# Patient Record
Sex: Female | Born: 1975 | Race: White | Hispanic: No | Marital: Single | Smoking: Current every day smoker
Health system: Southern US, Community
[De-identification: ages and names within clinical notes are randomized; demographics above are authoritative.]

## PROBLEM LIST (undated history)

## (undated) DIAGNOSIS — G8929 Other chronic pain: Secondary | ICD-10-CM

## (undated) DIAGNOSIS — A812 Progressive multifocal leukoencephalopathy: Secondary | ICD-10-CM

## (undated) DIAGNOSIS — R51 Headache: Secondary | ICD-10-CM

## (undated) DIAGNOSIS — J9819 Other pulmonary collapse: Secondary | ICD-10-CM

## (undated) DIAGNOSIS — R87613 High grade squamous intraepithelial lesion on cytologic smear of cervix (HGSIL): Secondary | ICD-10-CM

## (undated) DIAGNOSIS — R519 Headache, unspecified: Secondary | ICD-10-CM

## (undated) DIAGNOSIS — N390 Urinary tract infection, site not specified: Secondary | ICD-10-CM

## (undated) DIAGNOSIS — M549 Dorsalgia, unspecified: Secondary | ICD-10-CM

## (undated) DIAGNOSIS — E119 Type 2 diabetes mellitus without complications: Secondary | ICD-10-CM

## (undated) DIAGNOSIS — E039 Hypothyroidism, unspecified: Secondary | ICD-10-CM

## (undated) DIAGNOSIS — R569 Unspecified convulsions: Secondary | ICD-10-CM

## (undated) DIAGNOSIS — J449 Chronic obstructive pulmonary disease, unspecified: Secondary | ICD-10-CM

## (undated) DIAGNOSIS — K219 Gastro-esophageal reflux disease without esophagitis: Secondary | ICD-10-CM

## (undated) DIAGNOSIS — E7800 Pure hypercholesterolemia, unspecified: Secondary | ICD-10-CM

## (undated) DIAGNOSIS — K8020 Calculus of gallbladder without cholecystitis without obstruction: Secondary | ICD-10-CM

## (undated) DIAGNOSIS — B20 Human immunodeficiency virus [HIV] disease: Secondary | ICD-10-CM

## (undated) DIAGNOSIS — R413 Other amnesia: Secondary | ICD-10-CM

## (undated) HISTORY — PX: LUNG SURGERY: SHX703

---

## 2007-06-21 ENCOUNTER — Emergency Department: Payer: Self-pay | Admitting: Emergency Medicine

## 2007-06-24 ENCOUNTER — Emergency Department: Payer: Self-pay | Admitting: Emergency Medicine

## 2008-06-20 ENCOUNTER — Emergency Department: Payer: Self-pay | Admitting: Emergency Medicine

## 2009-07-25 ENCOUNTER — Emergency Department: Payer: Self-pay | Admitting: Emergency Medicine

## 2012-05-17 ENCOUNTER — Emergency Department: Payer: Self-pay | Admitting: *Deleted

## 2012-05-17 LAB — CBC
MCH: 32.6 pg (ref 26.0–34.0)
MCV: 92 fL (ref 80–100)
Platelet: 215 10*3/uL (ref 150–440)
RDW: 13.7 % (ref 11.5–14.5)

## 2012-05-17 LAB — COMPREHENSIVE METABOLIC PANEL
Albumin: 3.4 g/dL (ref 3.4–5.0)
Alkaline Phosphatase: 55 U/L (ref 50–136)
Anion Gap: 8 (ref 7–16)
BUN: 9 mg/dL (ref 7–18)
Bilirubin,Total: 0.8 mg/dL (ref 0.2–1.0)
EGFR (African American): >60
Glucose: 92 mg/dL (ref 65–99)
Osmolality: 268 (ref 275–301)
Potassium: 3.2 mmol/L — ABNORMAL LOW (ref 3.5–5.1)
SGOT(AST): 40 U/L — ABNORMAL HIGH (ref 15–37)
SGPT (ALT): 25 U/L

## 2012-05-17 LAB — URINALYSIS, COMPLETE
Blood: NEGATIVE
Granular Cast: 46
Ketone: NEGATIVE
Nitrite: NEGATIVE
Protein: 100
Specific Gravity: 1.024 (ref 1.003–1.030)

## 2012-05-17 LAB — PREGNANCY, URINE: Pregnancy Test, Urine: NEGATIVE m[IU]/mL

## 2012-05-20 LAB — URINE CULTURE

## 2012-09-13 ENCOUNTER — Emergency Department: Payer: Self-pay | Admitting: Emergency Medicine

## 2012-09-13 LAB — URINALYSIS, COMPLETE
Bilirubin,UR: NEGATIVE
Blood: NEGATIVE
Nitrite: POSITIVE
Protein: 100
RBC,UR: 21 /HPF (ref 0–5)
WBC UR: 375 /HPF (ref 0–5)

## 2012-09-13 LAB — COMPREHENSIVE METABOLIC PANEL
Albumin: 3.1 g/dL — ABNORMAL LOW (ref 3.4–5.0)
Anion Gap: 10 (ref 7–16)
BUN: 7 mg/dL (ref 7–18)
Bilirubin,Total: 0.7 mg/dL (ref 0.2–1.0)
Calcium, Total: 8.8 mg/dL (ref 8.5–10.1)
Chloride: 100 mmol/L (ref 98–107)
Creatinine: 0.74 mg/dL (ref 0.60–1.30)
Glucose: 98 mg/dL (ref 65–99)
Potassium: 2.8 mmol/L — ABNORMAL LOW (ref 3.5–5.1)
SGOT(AST): 28 U/L (ref 15–37)
Sodium: 136 mmol/L (ref 136–145)
Total Protein: 9.8 g/dL — ABNORMAL HIGH (ref 6.4–8.2)

## 2012-09-13 LAB — CBC
HCT: 32 % — ABNORMAL LOW (ref 35.0–47.0)
MCH: 30.8 pg (ref 26.0–34.0)
MCHC: 36 g/dL (ref 32.0–36.0)
MCV: 86 fL (ref 80–100)
Platelet: 194 10*3/uL (ref 150–440)
RBC: 3.74 10*6/uL — ABNORMAL LOW (ref 3.80–5.20)
RDW: 14.2 % (ref 11.5–14.5)

## 2012-10-12 ENCOUNTER — Emergency Department: Payer: Self-pay | Admitting: Emergency Medicine

## 2012-10-12 LAB — URINALYSIS, COMPLETE
Bacteria: NONE SEEN
Bilirubin,UR: NEGATIVE
Nitrite: NEGATIVE
Protein: NEGATIVE
Specific Gravity: 1.004 (ref 1.003–1.030)
WBC UR: 5 /HPF (ref 0–5)

## 2012-10-12 LAB — CBC
HCT: 32.2 % — ABNORMAL LOW (ref 35.0–47.0)
HGB: 11.2 g/dL — ABNORMAL LOW (ref 12.0–16.0)
MCHC: 34.7 g/dL (ref 32.0–36.0)
MCV: 89 fL (ref 80–100)
RDW: 18 % — ABNORMAL HIGH (ref 11.5–14.5)
WBC: 3.9 10*3/uL (ref 3.6–11.0)

## 2012-10-12 LAB — BASIC METABOLIC PANEL
Anion Gap: 9 (ref 7–16)
Calcium, Total: 8.7 mg/dL (ref 8.5–10.1)
Chloride: 105 mmol/L (ref 98–107)
Co2: 26 mmol/L (ref 21–32)
Creatinine: 0.62 mg/dL (ref 0.60–1.30)
Osmolality: 277 (ref 275–301)

## 2012-10-12 LAB — PROTIME-INR
INR: 0.9
Prothrombin Time: 12.9 secs (ref 11.5–14.7)

## 2012-10-12 LAB — HEPATIC FUNCTION PANEL A (ARMC)
Albumin: 3.3 g/dL — ABNORMAL LOW (ref 3.4–5.0)
Alkaline Phosphatase: 81 U/L (ref 50–136)
Bilirubin, Direct: <0.1 mg/dL (ref 0.00–0.20)
SGOT(AST): 114 U/L — ABNORMAL HIGH (ref 15–37)

## 2012-10-12 LAB — APTT: Activated PTT: 31.2 secs (ref 23.6–35.9)

## 2012-12-05 ENCOUNTER — Emergency Department: Payer: Self-pay | Admitting: Emergency Medicine

## 2012-12-31 ENCOUNTER — Ambulatory Visit: Payer: Self-pay | Admitting: Internal Medicine

## 2013-01-17 ENCOUNTER — Inpatient Hospital Stay: Payer: Self-pay | Admitting: Internal Medicine

## 2013-01-17 LAB — CBC WITH DIFFERENTIAL/PLATELET
Basophil #: 0 10*3/uL (ref 0.0–0.1)
Basophil %: 0.3 %
HGB: 11.2 g/dL — ABNORMAL LOW (ref 12.0–16.0)
Lymphocyte #: 1.2 10*3/uL (ref 1.0–3.6)
Lymphocyte %: 18.1 %
MCV: 88 fL (ref 80–100)
Monocyte #: 0.7 x10 3/mm (ref 0.2–0.9)
Monocyte %: 10.3 %
Neutrophil #: 4.7 10*3/uL (ref 1.4–6.5)
Neutrophil %: 71.2 %
Platelet: 288 10*3/uL (ref 150–440)
RBC: 3.66 10*6/uL — ABNORMAL LOW (ref 3.80–5.20)
RDW: 13 % (ref 11.5–14.5)
WBC: 6.7 10*3/uL (ref 3.6–11.0)

## 2013-01-17 LAB — COMPREHENSIVE METABOLIC PANEL
Albumin: 2.7 g/dL — ABNORMAL LOW (ref 3.4–5.0)
Alkaline Phosphatase: 59 U/L (ref 50–136)
BUN: 11 mg/dL (ref 7–18)
Bilirubin,Total: 0.5 mg/dL (ref 0.2–1.0)
Chloride: 99 mmol/L (ref 98–107)
Co2: 27 mmol/L (ref 21–32)
Creatinine: 0.78 mg/dL (ref 0.60–1.30)
EGFR (African American): >60
EGFR (Non-African Amer.): >60
Glucose: 107 mg/dL — ABNORMAL HIGH (ref 65–99)
Osmolality: 268 (ref 275–301)
Potassium: 2.7 mmol/L — ABNORMAL LOW (ref 3.5–5.1)
SGOT(AST): 64 U/L — ABNORMAL HIGH (ref 15–37)
SGPT (ALT): 13 U/L (ref 12–78)
Sodium: 134 mmol/L — ABNORMAL LOW (ref 136–145)
Total Protein: 9.7 g/dL — ABNORMAL HIGH (ref 6.4–8.2)

## 2013-01-17 LAB — URINALYSIS, COMPLETE
Glucose,UR: NEGATIVE mg/dL (ref 0–75)
Ketone: NEGATIVE
Nitrite: NEGATIVE
Protein: 100
RBC,UR: 47 /HPF (ref 0–5)

## 2013-01-17 LAB — LIPASE, BLOOD: Lipase: 311 U/L (ref 73–393)

## 2013-01-17 LAB — PRO B NATRIURETIC PEPTIDE: B-Type Natriuretic Peptide: 150 pg/mL — ABNORMAL HIGH (ref 0–125)

## 2013-01-17 LAB — TROPONIN I: Troponin-I: <0.02 ng/mL

## 2013-01-18 DIAGNOSIS — I369 Nonrheumatic tricuspid valve disorder, unspecified: Secondary | ICD-10-CM

## 2013-01-18 LAB — DRUG SCREEN, URINE
Amphetamines, Ur Screen: NEGATIVE (ref ?–1000)
Barbiturates, Ur Screen: NEGATIVE (ref ?–200)
Benzodiazepine, Ur Scrn: NEGATIVE (ref ?–200)
Cannabinoid 50 Ng, Ur ~~LOC~~: NEGATIVE (ref ?–50)
Opiate, Ur Screen: NEGATIVE (ref ?–300)
Phencyclidine (PCP) Ur S: NEGATIVE (ref ?–25)
Tricyclic, Ur Screen: NEGATIVE (ref ?–1000)

## 2013-01-18 LAB — BASIC METABOLIC PANEL
Anion Gap: 6 — ABNORMAL LOW (ref 7–16)
BUN: 9 mg/dL (ref 7–18)
Calcium, Total: 7.9 mg/dL — ABNORMAL LOW (ref 8.5–10.1)
Chloride: 108 mmol/L — ABNORMAL HIGH (ref 98–107)
Co2: 24 mmol/L (ref 21–32)
EGFR (African American): >60
Osmolality: 274 (ref 275–301)
Potassium: 3.7 mmol/L (ref 3.5–5.1)
Sodium: 138 mmol/L (ref 136–145)

## 2013-01-18 LAB — CBC WITH DIFFERENTIAL/PLATELET
Eosinophil #: 0 10*3/uL (ref 0.0–0.7)
HCT: 28.5 % — ABNORMAL LOW (ref 35.0–47.0)
MCHC: 34.4 g/dL (ref 32.0–36.0)
MCV: 88 fL (ref 80–100)
Monocyte #: 0.4 x10 3/mm (ref 0.2–0.9)
Neutrophil %: 70.9 %
Platelet: 228 10*3/uL (ref 150–440)
RBC: 3.24 10*6/uL — ABNORMAL LOW (ref 3.80–5.20)
RDW: 13.1 % (ref 11.5–14.5)
WBC: 3.9 10*3/uL (ref 3.6–11.0)

## 2013-01-18 LAB — RAPID HIV-1/2 QL/CONFIRM: HIV-1/2,Rapid Ql: POSITIVE

## 2013-01-18 LAB — HEMOGLOBIN A1C: Hemoglobin A1C: 4.4 % (ref 4.2–6.3)

## 2013-01-19 LAB — BASIC METABOLIC PANEL
BUN: 9 mg/dL (ref 7–18)
Calcium, Total: 8.4 mg/dL — ABNORMAL LOW (ref 8.5–10.1)
Chloride: 109 mmol/L — ABNORMAL HIGH (ref 98–107)
Co2: 25 mmol/L (ref 21–32)
EGFR (African American): >60
Osmolality: 281 (ref 275–301)

## 2013-01-19 LAB — LACTATE DEHYDROGENASE: LDH: 707 U/L — ABNORMAL HIGH (ref 81–246)

## 2013-01-20 LAB — CBC WITH DIFFERENTIAL/PLATELET
Basophil #: 0 10*3/uL (ref 0.0–0.1)
Basophil %: 0.1 %
Eosinophil #: 0 10*3/uL (ref 0.0–0.7)
Eosinophil %: 0 %
HCT: 25.8 % — ABNORMAL LOW (ref 35.0–47.0)
HGB: 9.2 g/dL — ABNORMAL LOW (ref 12.0–16.0)
Lymphocyte #: 0.4 10*3/uL — ABNORMAL LOW (ref 1.0–3.6)
Lymphocyte %: 5.3 %
MCH: 31.1 pg (ref 26.0–34.0)
MCV: 87 fL (ref 80–100)
Monocyte #: 0.4 x10 3/mm (ref 0.2–0.9)
Monocyte %: 4.8 %
Neutrophil #: 7.6 10*3/uL — ABNORMAL HIGH (ref 1.4–6.5)
Neutrophil %: 89.8 %
Platelet: 285 10*3/uL (ref 150–440)
RBC: 2.96 10*6/uL — ABNORMAL LOW (ref 3.80–5.20)
RDW: 13 % (ref 11.5–14.5)
WBC: 8.4 10*3/uL (ref 3.6–11.0)

## 2013-01-20 LAB — BASIC METABOLIC PANEL
Chloride: 109 mmol/L — ABNORMAL HIGH (ref 98–107)
EGFR (Non-African Amer.): >60
Glucose: 109 mg/dL — ABNORMAL HIGH (ref 65–99)
Osmolality: 279 (ref 275–301)

## 2013-01-20 LAB — URINE CULTURE

## 2013-01-23 LAB — CULTURE, BLOOD (SINGLE)

## 2013-01-24 LAB — PLATELET COUNT: Platelet: 389 10*3/uL (ref 150–440)

## 2013-01-25 LAB — CBC WITH DIFFERENTIAL/PLATELET
Basophil #: 0 10*3/uL (ref 0.0–0.1)
Basophil %: 0.2 %
Eosinophil #: 0 10*3/uL (ref 0.0–0.7)
HCT: 32.8 % — ABNORMAL LOW (ref 35.0–47.0)
HGB: 11.7 g/dL — ABNORMAL LOW (ref 12.0–16.0)
Lymphocyte #: 0.6 10*3/uL — ABNORMAL LOW (ref 1.0–3.6)
Lymphocyte %: 6.9 %
MCH: 31.4 pg (ref 26.0–34.0)
MCHC: 35.7 g/dL (ref 32.0–36.0)
Monocyte %: 5 %
RBC: 3.73 10*6/uL — ABNORMAL LOW (ref 3.80–5.20)
RDW: 13.4 % (ref 11.5–14.5)
WBC: 8.5 10*3/uL (ref 3.6–11.0)

## 2013-01-25 LAB — BASIC METABOLIC PANEL
Co2: 22 mmol/L (ref 21–32)
Creatinine: 0.63 mg/dL (ref 0.60–1.30)
Glucose: 120 mg/dL — ABNORMAL HIGH (ref 65–99)
Osmolality: 257 (ref 275–301)
Sodium: 127 mmol/L — ABNORMAL LOW (ref 136–145)

## 2013-01-25 LAB — OSMOLALITY, URINE: Osmolality: 472 mOsm/kg

## 2013-01-25 LAB — SODIUM, URINE, RANDOM: Sodium, Urine Random: 96 mmol/L (ref 20–110)

## 2013-01-26 LAB — CBC WITH DIFFERENTIAL/PLATELET
Basophil %: 0.1 %
Eosinophil #: 0 10*3/uL (ref 0.0–0.7)
HCT: 36.2 % (ref 35.0–47.0)
HGB: 12.8 g/dL (ref 12.0–16.0)
Lymphocyte #: 0.7 10*3/uL — ABNORMAL LOW (ref 1.0–3.6)
Lymphocyte %: 7 %
MCHC: 35.4 g/dL (ref 32.0–36.0)
MCV: 88 fL (ref 80–100)
Monocyte #: 0.7 x10 3/mm (ref 0.2–0.9)
Neutrophil #: 8.5 10*3/uL — ABNORMAL HIGH (ref 1.4–6.5)
Neutrophil %: 86.1 %
RBC: 4.1 10*6/uL (ref 3.80–5.20)
RDW: 13.8 % (ref 11.5–14.5)

## 2013-01-26 LAB — BASIC METABOLIC PANEL
BUN: 18 mg/dL (ref 7–18)
EGFR (Non-African Amer.): >60
Glucose: 85 mg/dL (ref 65–99)
Osmolality: 247 (ref 275–301)
Sodium: 122 mmol/L — ABNORMAL LOW (ref 136–145)

## 2013-01-27 LAB — CBC WITH DIFFERENTIAL/PLATELET
Basophil #: 0 10*3/uL (ref 0.0–0.1)
Basophil %: 0.1 %
Eosinophil #: 0.1 10*3/uL (ref 0.0–0.7)
Eosinophil %: 1.3 %
Lymphocyte #: 0.6 10*3/uL — ABNORMAL LOW (ref 1.0–3.6)
Lymphocyte %: 5.3 %
MCH: 29 pg (ref 26.0–34.0)
Monocyte #: 0.3 x10 3/mm (ref 0.2–0.9)
Neutrophil %: 90.1 %
Platelet: 390 10*3/uL (ref 150–440)
RBC: 4.24 10*6/uL (ref 3.80–5.20)
RDW: 13.8 % (ref 11.5–14.5)
WBC: 11 10*3/uL (ref 3.6–11.0)

## 2013-01-27 LAB — OSMOLALITY, URINE: Osmolality: 660 mOsm/kg

## 2013-01-27 LAB — BASIC METABOLIC PANEL
BUN: 24 mg/dL — ABNORMAL HIGH (ref 7–18)
Calcium, Total: 9.1 mg/dL (ref 8.5–10.1)
Creatinine: 0.72 mg/dL (ref 0.60–1.30)
EGFR (Non-African Amer.): >60
Glucose: 91 mg/dL (ref 65–99)
Potassium: 5 mmol/L (ref 3.5–5.1)
Sodium: 123 mmol/L — ABNORMAL LOW (ref 136–145)

## 2013-01-27 LAB — URIC ACID: Uric Acid: 1.6 mg/dL — ABNORMAL LOW (ref 2.6–6.0)

## 2013-01-27 LAB — SODIUM, URINE, RANDOM: Sodium, Urine Random: 75 mmol/L (ref 20–110)

## 2013-01-29 LAB — CBC WITH DIFFERENTIAL/PLATELET
Basophil %: 0.1 %
Eosinophil #: 0.2 10*3/uL (ref 0.0–0.7)
Eosinophil %: 1.3 %
HCT: 37 % (ref 35.0–47.0)
Lymphocyte #: 0.4 10*3/uL — ABNORMAL LOW (ref 1.0–3.6)
Lymphocyte %: 3.6 %
MCHC: 35.4 g/dL (ref 32.0–36.0)
MCV: 87 fL (ref 80–100)
Monocyte #: 0.4 x10 3/mm (ref 0.2–0.9)
Monocyte %: 3.2 %
Neutrophil #: 11.2 10*3/uL — ABNORMAL HIGH (ref 1.4–6.5)
Neutrophil %: 91.8 %
Platelet: 310 10*3/uL (ref 150–440)
RDW: 13.6 % (ref 11.5–14.5)

## 2013-01-29 LAB — BASIC METABOLIC PANEL
BUN: 19 mg/dL — ABNORMAL HIGH (ref 7–18)
BUN: 17 mg/dL (ref 7–18)
Calcium, Total: 8.8 mg/dL (ref 8.5–10.1)
Chloride: 84 mmol/L — ABNORMAL LOW (ref 98–107)
Co2: 26 mmol/L (ref 21–32)
Co2: 28 mmol/L (ref 21–32)
Creatinine: 0.82 mg/dL (ref 0.60–1.30)
Creatinine: 0.82 mg/dL (ref 0.60–1.30)
EGFR (African American): >60
Glucose: 88 mg/dL (ref 65–99)
Osmolality: 240 (ref 275–301)
Osmolality: 239 (ref 275–301)
Potassium: 5.2 mmol/L — ABNORMAL HIGH (ref 3.5–5.1)

## 2013-01-29 LAB — PRO B NATRIURETIC PEPTIDE: B-Type Natriuretic Peptide: 118 pg/mL (ref 0–125)

## 2013-01-30 LAB — BASIC METABOLIC PANEL
BUN: 17 mg/dL (ref 7–18)
Calcium, Total: 9.5 mg/dL (ref 8.5–10.1)
Chloride: 85 mmol/L — ABNORMAL LOW (ref 98–107)
Creatinine: 0.64 mg/dL (ref 0.60–1.30)
EGFR (Non-African Amer.): >60
Osmolality: 247 (ref 275–301)
Potassium: 4.8 mmol/L (ref 3.5–5.1)
Sodium: 122 mmol/L — ABNORMAL LOW (ref 136–145)

## 2013-01-31 ENCOUNTER — Ambulatory Visit: Payer: Self-pay | Admitting: Internal Medicine

## 2013-01-31 LAB — CBC WITH DIFFERENTIAL/PLATELET
Basophil #: 0 10*3/uL (ref 0.0–0.1)
Basophil %: 0.3 %
Eosinophil %: 1.6 %
MCH: 30.3 pg (ref 26.0–34.0)
MCHC: 34.5 g/dL (ref 32.0–36.0)
MCV: 88 fL (ref 80–100)
Monocyte %: 3.4 %
Neutrophil %: 90.5 %
RBC: 3.95 10*6/uL (ref 3.80–5.20)
WBC: 11 10*3/uL (ref 3.6–11.0)

## 2013-01-31 LAB — BASIC METABOLIC PANEL
Anion Gap: 9 (ref 7–16)
Chloride: 85 mmol/L — ABNORMAL LOW (ref 98–107)
EGFR (Non-African Amer.): >60
Glucose: 78 mg/dL (ref 65–99)
Potassium: 5 mmol/L (ref 3.5–5.1)

## 2013-02-01 LAB — BASIC METABOLIC PANEL
Anion Gap: 10 (ref 7–16)
Calcium, Total: 8.9 mg/dL (ref 8.5–10.1)
Co2: 28 mmol/L (ref 21–32)
EGFR (African American): >60
EGFR (Non-African Amer.): >60
Glucose: 85 mg/dL (ref 65–99)

## 2013-02-02 LAB — CBC WITH DIFFERENTIAL/PLATELET
Basophil %: 0.4 %
Eosinophil #: 0.3 10*3/uL (ref 0.0–0.7)
Eosinophil %: 5.7 %
Lymphocyte #: 0.3 10*3/uL — ABNORMAL LOW (ref 1.0–3.6)
Lymphocyte %: 5.6 %
MCH: 30.9 pg (ref 26.0–34.0)
MCHC: 34.9 g/dL (ref 32.0–36.0)
Monocyte #: 0.2 x10 3/mm (ref 0.2–0.9)
Neutrophil #: 4.8 10*3/uL (ref 1.4–6.5)
Neutrophil %: 84.6 %
RBC: 3.65 10*6/uL — ABNORMAL LOW (ref 3.80–5.20)
RDW: 14.6 % — ABNORMAL HIGH (ref 11.5–14.5)
WBC: 5.7 10*3/uL (ref 3.6–11.0)

## 2013-02-02 LAB — BASIC METABOLIC PANEL
Anion Gap: 8 (ref 7–16)
BUN: 14 mg/dL (ref 7–18)
Calcium, Total: 8.8 mg/dL (ref 8.5–10.1)
Chloride: 90 mmol/L — ABNORMAL LOW (ref 98–107)
Creatinine: 0.85 mg/dL (ref 0.60–1.30)
EGFR (Non-African Amer.): >60
Potassium: 4.2 mmol/L (ref 3.5–5.1)
Sodium: 124 mmol/L — ABNORMAL LOW (ref 136–145)

## 2013-02-04 LAB — BASIC METABOLIC PANEL
Anion Gap: 8 (ref 7–16)
BUN: 14 mg/dL (ref 7–18)
Calcium, Total: 8.5 mg/dL (ref 8.5–10.1)
Co2: 23 mmol/L (ref 21–32)
Creatinine: 0.7 mg/dL (ref 0.60–1.30)
EGFR (African American): >60
Glucose: 86 mg/dL (ref 65–99)
Sodium: 122 mmol/L — ABNORMAL LOW (ref 136–145)

## 2013-02-05 LAB — CBC WITH DIFFERENTIAL/PLATELET
Eosinophil %: 6.4 %
Lymphocyte #: 0.3 10*3/uL — ABNORMAL LOW (ref 1.0–3.6)
Lymphocyte %: 5.9 %
MCHC: 34.8 g/dL (ref 32.0–36.0)
Monocyte #: 0.3 x10 3/mm (ref 0.2–0.9)
Monocyte %: 4.8 %
Neutrophil #: 4.5 10*3/uL (ref 1.4–6.5)
Platelet: 186 10*3/uL (ref 150–440)
RBC: 3.5 10*6/uL — ABNORMAL LOW (ref 3.80–5.20)

## 2013-02-05 LAB — BASIC METABOLIC PANEL
Anion Gap: 6 — ABNORMAL LOW (ref 7–16)
BUN: 11 mg/dL (ref 7–18)
Calcium, Total: 8.8 mg/dL (ref 8.5–10.1)
Co2: 28 mmol/L (ref 21–32)
EGFR (African American): >60
EGFR (Non-African Amer.): >60
Glucose: 87 mg/dL (ref 65–99)
Osmolality: 252 (ref 275–301)
Potassium: 4.5 mmol/L (ref 3.5–5.1)

## 2013-02-07 LAB — BASIC METABOLIC PANEL
Anion Gap: 10 (ref 7–16)
Co2: 25 mmol/L (ref 21–32)
Creatinine: 0.6 mg/dL (ref 0.60–1.30)
EGFR (Non-African Amer.): >60
Glucose: 78 mg/dL (ref 65–99)
Osmolality: 252 (ref 275–301)

## 2013-02-08 LAB — BASIC METABOLIC PANEL
Anion Gap: 8 (ref 7–16)
BUN: 12 mg/dL (ref 7–18)
Calcium, Total: 8.7 mg/dL (ref 8.5–10.1)
Chloride: 92 mmol/L — ABNORMAL LOW (ref 98–107)
Co2: 23 mmol/L (ref 21–32)
Creatinine: 0.65 mg/dL (ref 0.60–1.30)
EGFR (African American): >60
EGFR (Non-African Amer.): >60
Glucose: 77 mg/dL (ref 65–99)
Osmolality: 246 (ref 275–301)
Potassium: 4.1 mmol/L (ref 3.5–5.1)
Sodium: 123 mmol/L — ABNORMAL LOW (ref 136–145)

## 2013-02-08 LAB — CBC WITH DIFFERENTIAL/PLATELET
Basophil #: 0 10*3/uL (ref 0.0–0.1)
Basophil %: 0.9 %
Eosinophil #: 0.3 10*3/uL (ref 0.0–0.7)
Eosinophil %: 6.1 %
HCT: 30.2 % — ABNORMAL LOW (ref 35.0–47.0)
HGB: 10.6 g/dL — ABNORMAL LOW (ref 12.0–16.0)
Lymphocyte #: 0.3 10*3/uL — ABNORMAL LOW (ref 1.0–3.6)
Lymphocyte %: 7.2 %
MCH: 31.7 pg (ref 26.0–34.0)
MCHC: 35.2 g/dL (ref 32.0–36.0)
MCV: 90 fL (ref 80–100)
Monocyte #: 0.3 x10 3/mm (ref 0.2–0.9)
Monocyte %: 6.8 %
Neutrophil #: 3.6 10*3/uL (ref 1.4–6.5)
Neutrophil %: 79 %
Platelet: 155 10*3/uL (ref 150–440)
RBC: 3.36 10*6/uL — ABNORMAL LOW (ref 3.80–5.20)
RDW: 15.4 % — ABNORMAL HIGH (ref 11.5–14.5)
WBC: 4.6 10*3/uL (ref 3.6–11.0)

## 2013-02-09 LAB — BASIC METABOLIC PANEL
Anion Gap: 9 (ref 7–16)
BUN: 10 mg/dL (ref 7–18)
Calcium, Total: 8.5 mg/dL (ref 8.5–10.1)
Creatinine: 0.57 mg/dL — ABNORMAL LOW (ref 0.60–1.30)
EGFR (African American): >60
Potassium: 4 mmol/L (ref 3.5–5.1)
Sodium: 125 mmol/L — ABNORMAL LOW (ref 136–145)

## 2013-02-12 LAB — PLATELET COUNT: Platelet: 185 x10 3/mm 3 (ref 150–440)

## 2013-02-12 LAB — SODIUM: Sodium: 129 mmol/L — ABNORMAL LOW (ref 136–145)

## 2013-02-15 LAB — BASIC METABOLIC PANEL
Anion Gap: 8 (ref 7–16)
BUN: 8 mg/dL (ref 7–18)
Calcium, Total: 8.5 mg/dL (ref 8.5–10.1)
Creatinine: 0.47 mg/dL — ABNORMAL LOW (ref 0.60–1.30)
EGFR (African American): >60
EGFR (Non-African Amer.): >60
Glucose: 89 mg/dL (ref 65–99)
Osmolality: 260 (ref 275–301)
Potassium: 3.9 mmol/L (ref 3.5–5.1)
Sodium: 131 mmol/L — ABNORMAL LOW (ref 136–145)

## 2013-02-15 LAB — CBC WITH DIFFERENTIAL/PLATELET
Basophil #: 0 10*3/uL (ref 0.0–0.1)
Eosinophil %: 11.7 %
HCT: 27.3 % — ABNORMAL LOW (ref 35.0–47.0)
Lymphocyte #: 0.4 10*3/uL — ABNORMAL LOW (ref 1.0–3.6)
Lymphocyte %: 15.8 %
MCH: 31.3 pg (ref 26.0–34.0)
MCV: 91 fL (ref 80–100)
Monocyte %: 19.6 %
Neutrophil %: 52 %
RDW: 16.7 % — ABNORMAL HIGH (ref 11.5–14.5)

## 2013-02-17 LAB — PROTIME-INR: INR: 1

## 2013-02-17 LAB — APTT: Activated PTT: 40 secs — ABNORMAL HIGH (ref 23.6–35.9)

## 2013-02-19 LAB — CBC WITH DIFFERENTIAL/PLATELET
Basophil %: 0.4 %
Lymphocyte #: 0.7 10*3/uL — ABNORMAL LOW (ref 1.0–3.6)
Lymphocyte %: 18.6 %
Monocyte #: 0.7 x10 3/mm (ref 0.2–0.9)
Neutrophil #: 2.3 10*3/uL (ref 1.4–6.5)
Neutrophil %: 61.1 %
Platelet: 254 10*3/uL (ref 150–440)
RBC: 3.12 10*6/uL — ABNORMAL LOW (ref 3.80–5.20)
RDW: 17.1 % — ABNORMAL HIGH (ref 11.5–14.5)
WBC: 3.8 10*3/uL (ref 3.6–11.0)

## 2013-02-19 LAB — BASIC METABOLIC PANEL
Anion Gap: 8 (ref 7–16)
BUN: 4 mg/dL — ABNORMAL LOW (ref 7–18)
Calcium, Total: 8 mg/dL — ABNORMAL LOW (ref 8.5–10.1)
Co2: 24 mmol/L (ref 21–32)
EGFR (Non-African Amer.): >60
Glucose: 90 mg/dL (ref 65–99)
Potassium: 3.9 mmol/L (ref 3.5–5.1)
Sodium: 133 mmol/L — ABNORMAL LOW (ref 136–145)

## 2013-02-21 LAB — BASIC METABOLIC PANEL
Anion Gap: 8 (ref 7–16)
Calcium, Total: 7.8 mg/dL — ABNORMAL LOW (ref 8.5–10.1)
Chloride: 98 mmol/L (ref 98–107)
Co2: 27 mmol/L (ref 21–32)
Creatinine: 0.47 mg/dL — ABNORMAL LOW (ref 0.60–1.30)
EGFR (African American): >60
EGFR (Non-African Amer.): >60
Osmolality: 263 (ref 275–301)
Potassium: 3.9 mmol/L (ref 3.5–5.1)
Sodium: 133 mmol/L — ABNORMAL LOW (ref 136–145)

## 2013-02-23 LAB — BASIC METABOLIC PANEL
Anion Gap: 8 (ref 7–16)
BUN: 5 mg/dL — ABNORMAL LOW (ref 7–18)
Calcium, Total: 8.2 mg/dL — ABNORMAL LOW (ref 8.5–10.1)
Chloride: 98 mmol/L (ref 98–107)
Co2: 27 mmol/L (ref 21–32)
EGFR (African American): >60
Glucose: 87 mg/dL (ref 65–99)
Sodium: 133 mmol/L — ABNORMAL LOW (ref 136–145)

## 2013-02-23 LAB — PATHOLOGY REPORT

## 2013-02-27 LAB — CREATININE, SERUM
EGFR (African American): >60
EGFR (Non-African Amer.): >60

## 2013-03-02 LAB — BASIC METABOLIC PANEL
BUN: 6 mg/dL — ABNORMAL LOW (ref 7–18)
Co2: 27 mmol/L (ref 21–32)
Creatinine: 0.52 mg/dL — ABNORMAL LOW (ref 0.60–1.30)
EGFR (African American): >60
EGFR (Non-African Amer.): >60
Glucose: 84 mg/dL (ref 65–99)
Osmolality: 267 (ref 275–301)
Potassium: 4 mmol/L (ref 3.5–5.1)
Sodium: 135 mmol/L — ABNORMAL LOW (ref 136–145)

## 2013-03-02 LAB — CBC WITH DIFFERENTIAL/PLATELET
Eosinophil %: 4.4 %
HCT: 28.2 % — ABNORMAL LOW (ref 35.0–47.0)
HGB: 9.5 g/dL — ABNORMAL LOW (ref 12.0–16.0)
Lymphocyte %: 27.4 %
MCH: 30.4 pg (ref 26.0–34.0)
MCHC: 33.8 g/dL (ref 32.0–36.0)
MCV: 90 fL (ref 80–100)
Monocyte %: 14.8 %
Platelet: 264 10*3/uL (ref 150–440)
RBC: 3.13 10*6/uL — ABNORMAL LOW (ref 3.80–5.20)
RDW: 16.5 % — ABNORMAL HIGH (ref 11.5–14.5)

## 2013-03-03 LAB — HCG, QUANTITATIVE, PREGNANCY: Beta Hcg, Quant.: <1 m[IU]/mL — ABNORMAL LOW

## 2013-03-03 LAB — PROTIME-INR
INR: 1.1
Prothrombin Time: 14.2 secs (ref 11.5–14.7)

## 2013-03-03 LAB — APTT: Activated PTT: 43 secs — ABNORMAL HIGH (ref 23.6–35.9)

## 2013-03-08 LAB — CBC WITH DIFFERENTIAL/PLATELET
Basophil #: 0 10*3/uL (ref 0.0–0.1)
Basophil %: 0.3 %
Eosinophil %: 0 %
HCT: 29.1 % — ABNORMAL LOW (ref 35.0–47.0)
Lymphocyte #: 1.4 10*3/uL (ref 1.0–3.6)
MCH: 31.4 pg (ref 26.0–34.0)
MCHC: 35.7 g/dL (ref 32.0–36.0)
MCV: 88 fL (ref 80–100)
Monocyte #: 1.3 x10 3/mm — ABNORMAL HIGH (ref 0.2–0.9)
Neutrophil #: 5.7 10*3/uL (ref 1.4–6.5)
Neutrophil %: 67.5 %
RBC: 3.31 10*6/uL — ABNORMAL LOW (ref 3.80–5.20)
RDW: 16.8 % — ABNORMAL HIGH (ref 11.5–14.5)

## 2013-03-08 LAB — COMPREHENSIVE METABOLIC PANEL
Albumin: 2.4 g/dL — ABNORMAL LOW (ref 3.4–5.0)
Anion Gap: 9 (ref 7–16)
Calcium, Total: 8.4 mg/dL — ABNORMAL LOW (ref 8.5–10.1)
Chloride: 98 mmol/L (ref 98–107)
Co2: 25 mmol/L (ref 21–32)
Creatinine: 0.5 mg/dL — ABNORMAL LOW (ref 0.60–1.30)
EGFR (African American): >60
EGFR (Non-African Amer.): >60
Glucose: 100 mg/dL — ABNORMAL HIGH (ref 65–99)
SGOT(AST): 27 U/L (ref 15–37)
SGPT (ALT): 14 U/L (ref 12–78)

## 2013-03-09 LAB — CBC WITH DIFFERENTIAL/PLATELET
Basophil #: 0 10*3/uL (ref 0.0–0.1)
Basophil %: 0.1 %
Eosinophil #: 0 10*3/uL (ref 0.0–0.7)
Eosinophil %: 0.1 %
HCT: 24.6 % — ABNORMAL LOW (ref 35.0–47.0)
HGB: 8.3 g/dL — ABNORMAL LOW (ref 12.0–16.0)
Lymphocyte #: 0.8 10*3/uL — ABNORMAL LOW (ref 1.0–3.6)
Lymphocyte %: 18.5 %
MCH: 30.1 pg (ref 26.0–34.0)
Monocyte #: 0.7 x10 3/mm (ref 0.2–0.9)
Neutrophil %: 65 %
Platelet: 109 10*3/uL — ABNORMAL LOW (ref 150–440)
RBC: 2.77 10*6/uL — ABNORMAL LOW (ref 3.80–5.20)

## 2013-03-09 LAB — BASIC METABOLIC PANEL
Anion Gap: 3 — ABNORMAL LOW (ref 7–16)
BUN: 7 mg/dL (ref 7–18)
Calcium, Total: 7.6 mg/dL — ABNORMAL LOW (ref 8.5–10.1)
Chloride: 104 mmol/L (ref 98–107)
Co2: 28 mmol/L (ref 21–32)
EGFR (African American): >60
EGFR (Non-African Amer.): >60
Osmolality: 268 (ref 275–301)
Potassium: 3.5 mmol/L (ref 3.5–5.1)

## 2013-03-11 LAB — SODIUM: Sodium: 142 mmol/L (ref 136–145)

## 2013-03-11 LAB — CULTURE, FUNGUS WITHOUT SMEAR

## 2013-03-12 LAB — IRON AND TIBC
Iron Saturation: 29 %
Iron: 32 ug/dL — ABNORMAL LOW (ref 50–170)
Unbound Iron-Bind.Cap.: 77 ug/dL

## 2013-03-12 LAB — FERRITIN: Ferritin (ARMC): 422 ng/mL — ABNORMAL HIGH (ref 8–388)

## 2013-03-12 LAB — HEMOGLOBIN: HGB: 8 g/dL — ABNORMAL LOW (ref 12.0–16.0)

## 2013-03-13 LAB — BASIC METABOLIC PANEL
Calcium, Total: 8.2 mg/dL — ABNORMAL LOW (ref 8.5–10.1)
Creatinine: 0.44 mg/dL — ABNORMAL LOW (ref 0.60–1.30)
EGFR (African American): >60
EGFR (Non-African Amer.): >60
Glucose: 88 mg/dL (ref 65–99)
Osmolality: 272 (ref 275–301)
Potassium: 3.6 mmol/L (ref 3.5–5.1)
Sodium: 138 mmol/L (ref 136–145)

## 2013-03-13 LAB — MISC AER/ANAEROBIC CULT.

## 2013-04-10 ENCOUNTER — Inpatient Hospital Stay: Payer: Self-pay | Admitting: Internal Medicine

## 2013-04-10 LAB — URINALYSIS, COMPLETE
Bilirubin,UR: NEGATIVE
Blood: NEGATIVE
Glucose,UR: NEGATIVE mg/dL (ref 0–75)
Ketone: NEGATIVE
Nitrite: NEGATIVE
RBC,UR: 1 /HPF (ref 0–5)
Specific Gravity: 1.01 (ref 1.003–1.030)
WBC UR: 59 /HPF (ref 0–5)

## 2013-04-10 LAB — COMPREHENSIVE METABOLIC PANEL
Alkaline Phosphatase: 58 U/L (ref 50–136)
Anion Gap: 6 — ABNORMAL LOW (ref 7–16)
BUN: 6 mg/dL — ABNORMAL LOW (ref 7–18)
Calcium, Total: 8.9 mg/dL (ref 8.5–10.1)
Chloride: 102 mmol/L (ref 98–107)
Co2: 28 mmol/L (ref 21–32)
Creatinine: 0.67 mg/dL (ref 0.60–1.30)
EGFR (African American): >60
EGFR (Non-African Amer.): >60
Glucose: 95 mg/dL (ref 65–99)
Osmolality: 269 (ref 275–301)
Potassium: 3.2 mmol/L — ABNORMAL LOW (ref 3.5–5.1)
SGOT(AST): 27 U/L (ref 15–37)
Sodium: 136 mmol/L (ref 136–145)
Total Protein: 9.1 g/dL — ABNORMAL HIGH (ref 6.4–8.2)

## 2013-04-10 LAB — CBC WITH DIFFERENTIAL/PLATELET
Basophil #: 0 10*3/uL (ref 0.0–0.1)
Basophil %: 0.2 %
Eosinophil %: 0.5 %
HGB: 11 g/dL — ABNORMAL LOW (ref 12.0–16.0)
Lymphocyte #: 1.1 10*3/uL (ref 1.0–3.6)
Monocyte #: 0.6 x10 3/mm (ref 0.2–0.9)
Neutrophil #: 3.9 10*3/uL (ref 1.4–6.5)
Neutrophil %: 69 %
Platelet: 222 10*3/uL (ref 150–440)
RBC: 3.58 10*6/uL — ABNORMAL LOW (ref 3.80–5.20)
RDW: 19.4 % — ABNORMAL HIGH (ref 11.5–14.5)

## 2013-04-10 LAB — TROPONIN I: Troponin-I: <0.02 ng/mL

## 2013-04-10 LAB — LACTATE DEHYDROGENASE: LDH: 203 U/L (ref 81–246)

## 2013-04-11 LAB — COMPREHENSIVE METABOLIC PANEL
BUN: 6 mg/dL — ABNORMAL LOW (ref 7–18)
Bilirubin,Total: 0.3 mg/dL (ref 0.2–1.0)
Calcium, Total: 8.3 mg/dL — ABNORMAL LOW (ref 8.5–10.1)
Creatinine: 0.64 mg/dL (ref 0.60–1.30)
Glucose: 133 mg/dL — ABNORMAL HIGH (ref 65–99)
Potassium: 3.9 mmol/L (ref 3.5–5.1)
SGOT(AST): 22 U/L (ref 15–37)
Total Protein: 7.6 g/dL (ref 6.4–8.2)

## 2013-04-11 LAB — TROPONIN I
Troponin-I: <0.02 ng/mL
Troponin-I: <0.02 ng/mL

## 2013-04-11 LAB — CBC WITH DIFFERENTIAL/PLATELET
Basophil #: 0 10*3/uL (ref 0.0–0.1)
Basophil %: 0.3 %
Eosinophil #: 0 10*3/uL (ref 0.0–0.7)
Eosinophil %: 0.2 %
Lymphocyte #: 0.6 10*3/uL — ABNORMAL LOW (ref 1.0–3.6)
MCH: 30.6 pg (ref 26.0–34.0)
MCV: 92 fL (ref 80–100)
Monocyte #: 0.1 x10 3/mm — ABNORMAL LOW (ref 0.2–0.9)
Neutrophil #: 1.5 10*3/uL (ref 1.4–6.5)
Neutrophil %: 67.9 %
Platelet: 162 10*3/uL (ref 150–440)
RDW: 19 % — ABNORMAL HIGH (ref 11.5–14.5)

## 2013-04-12 LAB — CBC WITH DIFFERENTIAL/PLATELET
Eosinophil #: 0 10*3/uL (ref 0.0–0.7)
Eosinophil %: 0 %
HCT: 26.5 % — ABNORMAL LOW (ref 35.0–47.0)
HGB: 8.7 g/dL — ABNORMAL LOW (ref 12.0–16.0)
Lymphocyte #: 0.8 10*3/uL — ABNORMAL LOW (ref 1.0–3.6)
Lymphocyte %: 9.4 %
MCH: 30.3 pg (ref 26.0–34.0)
MCHC: 32.9 g/dL (ref 32.0–36.0)
MCV: 92 fL (ref 80–100)
Monocyte #: 0.3 x10 3/mm (ref 0.2–0.9)
Neutrophil #: 6.9 10*3/uL — ABNORMAL HIGH (ref 1.4–6.5)
Neutrophil %: 86.2 %
Platelet: 192 10*3/uL (ref 150–440)
RBC: 2.88 10*6/uL — ABNORMAL LOW (ref 3.80–5.20)

## 2013-04-12 LAB — BASIC METABOLIC PANEL
Anion Gap: 5 — ABNORMAL LOW (ref 7–16)
BUN: 9 mg/dL (ref 7–18)
Creatinine: 0.38 mg/dL — ABNORMAL LOW (ref 0.60–1.30)
EGFR (African American): >60
Glucose: 144 mg/dL — ABNORMAL HIGH (ref 65–99)
Potassium: 4.3 mmol/L (ref 3.5–5.1)
Sodium: 144 mmol/L (ref 136–145)

## 2013-04-13 LAB — CBC WITH DIFFERENTIAL/PLATELET
Basophil #: 0 10*3/uL (ref 0.0–0.1)
Basophil %: 0 %
Eosinophil #: 0 10*3/uL (ref 0.0–0.7)
HCT: 29.2 % — ABNORMAL LOW (ref 35.0–47.0)
Lymphocyte #: 0.6 10*3/uL — ABNORMAL LOW (ref 1.0–3.6)
MCHC: 34.1 g/dL (ref 32.0–36.0)
MCV: 92 fL (ref 80–100)
Monocyte #: 0.1 x10 3/mm — ABNORMAL LOW (ref 0.2–0.9)
Monocyte %: 2.4 %
Neutrophil #: 5 10*3/uL (ref 1.4–6.5)
Neutrophil %: 87.5 %
Platelet: 244 10*3/uL (ref 150–440)
RBC: 3.17 10*6/uL — ABNORMAL LOW (ref 3.80–5.20)
WBC: 5.7 10*3/uL (ref 3.6–11.0)

## 2013-04-13 LAB — BASIC METABOLIC PANEL
Chloride: 112 mmol/L — ABNORMAL HIGH (ref 98–107)
EGFR (African American): >60
EGFR (Non-African Amer.): >60
Osmolality: 284 (ref 275–301)
Potassium: 4.6 mmol/L (ref 3.5–5.1)
Sodium: 142 mmol/L (ref 136–145)

## 2013-04-16 LAB — CBC WITH DIFFERENTIAL/PLATELET
Basophil #: 0 10*3/uL (ref 0.0–0.1)
Basophil %: 0.3 %
Eosinophil #: 0.2 10*3/uL (ref 0.0–0.7)
Eosinophil %: 3.3 %
HCT: 29.3 % — ABNORMAL LOW (ref 35.0–47.0)
HGB: 10.3 g/dL — ABNORMAL LOW (ref 12.0–16.0)
Lymphocyte #: 1.2 10*3/uL (ref 1.0–3.6)
Lymphocyte %: 23.1 %
MCH: 32.2 pg (ref 26.0–34.0)
MCHC: 35.2 g/dL (ref 32.0–36.0)
MCV: 92 fL (ref 80–100)
Monocyte #: 0.9 x10 3/mm (ref 0.2–0.9)
Monocyte %: 17.2 %
Neutrophil #: 2.9 10*3/uL (ref 1.4–6.5)
Neutrophil %: 56.1 %
Platelet: 279 10*3/uL (ref 150–440)
RBC: 3.2 10*6/uL — ABNORMAL LOW (ref 3.80–5.20)
RDW: 18.2 % — ABNORMAL HIGH (ref 11.5–14.5)
WBC: 5.1 10*3/uL (ref 3.6–11.0)

## 2013-04-16 LAB — BASIC METABOLIC PANEL
Anion Gap: 4 — ABNORMAL LOW (ref 7–16)
BUN: 19 mg/dL — ABNORMAL HIGH (ref 7–18)
Calcium, Total: 8.5 mg/dL (ref 8.5–10.1)
EGFR (Non-African Amer.): >60
Potassium: 4.1 mmol/L (ref 3.5–5.1)

## 2013-04-16 LAB — CULTURE, BLOOD (SINGLE)

## 2013-04-18 LAB — EXPECTORATED SPUTUM ASSESSMENT W GRAM STAIN, RFLX TO RESP C

## 2013-04-21 DIAGNOSIS — E119 Type 2 diabetes mellitus without complications: Secondary | ICD-10-CM | POA: Insufficient documentation

## 2013-05-19 ENCOUNTER — Observation Stay: Payer: Self-pay | Admitting: Internal Medicine

## 2013-05-19 LAB — URINALYSIS, COMPLETE
Bacteria: NONE SEEN
Bilirubin,UR: NEGATIVE
Blood: NEGATIVE
Ketone: NEGATIVE
Ph: 7 (ref 4.5–8.0)
Specific Gravity: 1.018 (ref 1.003–1.030)
Squamous Epithelial: 1
WBC UR: 4 /HPF (ref 0–5)

## 2013-05-19 LAB — CBC WITH DIFFERENTIAL/PLATELET
Basophil #: 0 10*3/uL (ref 0.0–0.1)
Basophil %: 0.6 %
Eosinophil #: 0.2 10*3/uL (ref 0.0–0.7)
HCT: 32.4 % — ABNORMAL LOW (ref 35.0–47.0)
HGB: 11.4 g/dL — ABNORMAL LOW (ref 12.0–16.0)
MCH: 31.8 pg (ref 26.0–34.0)
Monocyte %: 13.5 %
Neutrophil #: 3.6 10*3/uL (ref 1.4–6.5)
Neutrophil %: 59.2 %
RBC: 3.59 10*6/uL — ABNORMAL LOW (ref 3.80–5.20)
RDW: 15.1 % — ABNORMAL HIGH (ref 11.5–14.5)
WBC: 6 10*3/uL (ref 3.6–11.0)

## 2013-05-19 LAB — COMPREHENSIVE METABOLIC PANEL
Bilirubin,Total: 0.2 mg/dL (ref 0.2–1.0)
Chloride: 103 mmol/L (ref 98–107)
Co2: 22 mmol/L (ref 21–32)
Creatinine: 0.78 mg/dL (ref 0.60–1.30)
EGFR (African American): >60
Osmolality: 265 (ref 275–301)
Potassium: 4 mmol/L (ref 3.5–5.1)
SGOT(AST): 183 U/L — ABNORMAL HIGH (ref 15–37)
SGPT (ALT): 224 U/L — ABNORMAL HIGH (ref 12–78)
Sodium: 132 mmol/L — ABNORMAL LOW (ref 136–145)

## 2013-05-19 LAB — WET PREP, GENITAL

## 2013-05-19 LAB — LACTATE DEHYDROGENASE: LDH: 224 U/L (ref 81–246)

## 2013-05-19 LAB — LIPASE, BLOOD: Lipase: 363 U/L (ref 73–393)

## 2013-05-19 LAB — GC/CHLAMYDIA PROBE AMP

## 2013-05-20 LAB — URINE CULTURE

## 2013-05-20 LAB — HEPATIC FUNCTION PANEL A (ARMC)
Albumin: 2.8 g/dL — ABNORMAL LOW (ref 3.4–5.0)
Bilirubin, Direct: <0.1 mg/dL (ref 0.00–0.20)
Bilirubin,Total: 0.2 mg/dL (ref 0.2–1.0)
SGOT(AST): 165 U/L — ABNORMAL HIGH (ref 15–37)
SGPT (ALT): 197 U/L — ABNORMAL HIGH (ref 12–78)

## 2013-05-22 LAB — HEPATIC FUNCTION PANEL A (ARMC)
Bilirubin, Direct: <0.1 mg/dL (ref 0.00–0.20)
Bilirubin,Total: 0.1 mg/dL — ABNORMAL LOW (ref 0.2–1.0)
SGPT (ALT): 355 U/L — ABNORMAL HIGH (ref 12–78)
Total Protein: 7.5 g/dL (ref 6.4–8.2)

## 2013-06-03 ENCOUNTER — Emergency Department: Payer: Self-pay | Admitting: Emergency Medicine

## 2013-06-03 LAB — COMPREHENSIVE METABOLIC PANEL
Albumin: 3.1 g/dL — ABNORMAL LOW (ref 3.4–5.0)
BUN: 8 mg/dL (ref 7–18)
Bilirubin,Total: 0.2 mg/dL (ref 0.2–1.0)
Calcium, Total: 8.8 mg/dL (ref 8.5–10.1)
Chloride: 109 mmol/L — ABNORMAL HIGH (ref 98–107)
EGFR (African American): >60
Glucose: 99 mg/dL (ref 65–99)
Potassium: 3.3 mmol/L — ABNORMAL LOW (ref 3.5–5.1)
SGPT (ALT): 42 U/L (ref 12–78)
Sodium: 139 mmol/L (ref 136–145)

## 2013-06-03 LAB — URINALYSIS, COMPLETE
Glucose,UR: NEGATIVE mg/dL (ref 0–75)
Nitrite: NEGATIVE
Ph: 7 (ref 4.5–8.0)
Protein: NEGATIVE
RBC,UR: NONE SEEN /HPF (ref 0–5)
Specific Gravity: 1.015 (ref 1.003–1.030)
Squamous Epithelial: 1
WBC UR: 2 /HPF (ref 0–5)

## 2013-06-03 LAB — CBC
HCT: 31.2 % — ABNORMAL LOW (ref 35.0–47.0)
HGB: 10.8 g/dL — ABNORMAL LOW (ref 12.0–16.0)
MCH: 31.3 pg (ref 26.0–34.0)
MCHC: 34.7 g/dL (ref 32.0–36.0)
MCV: 90 fL (ref 80–100)
Platelet: 222 10*3/uL (ref 150–440)
RDW: 14 % (ref 11.5–14.5)
WBC: 5.6 10*3/uL (ref 3.6–11.0)

## 2013-06-03 LAB — LIPASE, BLOOD: Lipase: 423 U/L — ABNORMAL HIGH (ref 73–393)

## 2013-08-02 ENCOUNTER — Inpatient Hospital Stay: Payer: Self-pay | Admitting: Internal Medicine

## 2013-08-02 LAB — CBC WITH DIFFERENTIAL/PLATELET
Basophil #: 0 10*3/uL (ref 0.0–0.1)
Basophil %: 0.7 %
Eosinophil #: 0.2 10*3/uL (ref 0.0–0.7)
Eosinophil %: 4.8 %
HCT: 34.1 % — ABNORMAL LOW (ref 35.0–47.0)
HGB: 12.3 g/dL (ref 12.0–16.0)
Lymphocyte #: 1.7 10*3/uL (ref 1.0–3.6)
Lymphocyte %: 35.3 %
MCH: 31.1 pg (ref 26.0–34.0)
MCHC: 36 g/dL (ref 32.0–36.0)
MCV: 86 fL (ref 80–100)
Monocyte #: 0.8 x10 3/mm (ref 0.2–0.9)
Monocyte %: 16.3 %
Neutrophil #: 2.1 10*3/uL (ref 1.4–6.5)
Neutrophil %: 42.9 %
RBC: 3.94 10*6/uL (ref 3.80–5.20)

## 2013-08-02 LAB — COMPREHENSIVE METABOLIC PANEL
Albumin: 3.3 g/dL — ABNORMAL LOW (ref 3.4–5.0)
Alkaline Phosphatase: 44 U/L — ABNORMAL LOW (ref 50–136)
Anion Gap: 3 — ABNORMAL LOW (ref 7–16)
BUN: 7 mg/dL (ref 7–18)
Bilirubin,Total: 0.5 mg/dL (ref 0.2–1.0)
Calcium, Total: 8.9 mg/dL (ref 8.5–10.1)
Co2: 29 mmol/L (ref 21–32)
Creatinine: 0.81 mg/dL (ref 0.60–1.30)
EGFR (Non-African Amer.): >60
Glucose: 107 mg/dL — ABNORMAL HIGH (ref 65–99)
Osmolality: 270 (ref 275–301)
Potassium: 2.8 mmol/L — ABNORMAL LOW (ref 3.5–5.1)
SGOT(AST): 31 U/L (ref 15–37)
SGPT (ALT): 21 U/L (ref 12–78)
Sodium: 136 mmol/L (ref 136–145)
Total Protein: 8.7 g/dL — ABNORMAL HIGH (ref 6.4–8.2)

## 2013-08-02 LAB — URINALYSIS, COMPLETE
Bilirubin,UR: NEGATIVE
Ketone: NEGATIVE
Nitrite: POSITIVE
Ph: 6 (ref 4.5–8.0)
Specific Gravity: 1.011 (ref 1.003–1.030)
Squamous Epithelial: 1
WBC UR: 126 /HPF (ref 0–5)

## 2013-08-02 LAB — DRUG SCREEN, URINE
Amphetamines, Ur Screen: NEGATIVE (ref ?–1000)
Barbiturates, Ur Screen: NEGATIVE (ref ?–200)
MDMA (Ecstasy)Ur Screen: NEGATIVE (ref ?–500)
Methadone, Ur Screen: NEGATIVE (ref ?–300)
Phencyclidine (PCP) Ur S: NEGATIVE (ref ?–25)
Tricyclic, Ur Screen: NEGATIVE (ref ?–1000)

## 2013-08-02 LAB — LIPASE, BLOOD: Lipase: 196 U/L (ref 73–393)

## 2013-08-08 LAB — CULTURE, BLOOD (SINGLE)

## 2013-09-07 DIAGNOSIS — A812 Progressive multifocal leukoencephalopathy: Secondary | ICD-10-CM | POA: Insufficient documentation

## 2014-07-21 ENCOUNTER — Emergency Department: Payer: Self-pay | Admitting: Emergency Medicine

## 2014-08-31 DIAGNOSIS — B350 Tinea barbae and tinea capitis: Secondary | ICD-10-CM | POA: Insufficient documentation

## 2014-11-08 ENCOUNTER — Emergency Department: Payer: Self-pay | Admitting: Internal Medicine

## 2015-04-22 NOTE — Consult Note (Signed)
PATIENT NAME:  Kristen Villegas, Kristen Villegas MR#:  [id number] DATE OF BIRTH:  21-Jun-1976  DATE OF CONSULTATION:  01/28/2013  REFERRING PHYSICIAN:   CONSULTING PHYSICIAN:  Cristal Deerhristopher A. Elta Angell, MD  REASON FOR CONSULTATION:  Tension pneumothorax.   HISTORY OF PRESENT ILLNESS: The patient is a pleasant, unfortunate 39 year old female who was admitted back on January 18 for nausea, vomiting, shortness of breath and was found to have HIV and persistent pneumonia. She is found to have a CD4 count of 14 and is being worked up for that. Today on in her room she had acutely worse breathing and dropped her sats and was found extremely fatigued. She had an x-ray performed at that time which showed a large right-sided pneumothorax with what appeared to be mediastinal shift, I thus was consulted for chest tube placement. Her history is not directly from her as during my time of seeing her, she was extremely short of breath and uncomfortable and afterwards she was sedated from her pain medication; however, she does report a 120 pound weight loss, recent fevers, chills, shortness of breath, cough, a recent nausea, vomiting and diarrhea, constipation. No abdominal pain, no dysuria or hematuria currently.   HISTORY OF PRESENT ILLNESS:  1.  Acute respiratory failure and tension pneumothorax.  2.  Pneumocystis pneumonia.  3.  History of human immunodeficiency virus, new diagnosis CD14, Escherichia coli infection.  4.  Tobacco use.   ALLERGIES: PENICILLIN - SWELLING.   CURRENT MEDICATIONS: Tylenol p.Villegas.n., sliding scale insulin, Vicodin p.Villegas.n., tiotropium inhaler, Bactrim, lorazepam, Mucinex, magnesium hydroxide, polyethylene glycol, nystatin swish swallow, azithromycin, prednisone, Colace, heparin, Dilaudid PCA, senna.   SOCIAL HISTORY: One-half pack of tobacco per week. No drugs. No alcohol.   FAMILY HISTORY: Diabetes and stroke in her mother.   PHYSICAL EXAM:  VITAL SIGNS: When I saw her was temperature 98.1, pulse  118, blood pressure 116/84, respirations 3 times a minute, 74% on a nonrebreather face mask.  HEAD: Normocephalic, atraumatic.  GENERAL:   Appears uncomfortable and ill.  EYES: No scleral icterus. No conjunctivitis.  FACE: Appears to be emaciated with sinking of her eyes and cheek bones.  NECK: No obvious abnormalities.  HEART: Tachycardic. Normal rhythm.  CHEST: Lungs clear on the left. No distant lung sounds on right.  ABDOMEN: Soft, nontender, nondistended.  EXTREMITIES: Moves all extremities well.   LABORATORY DATA:  No recent labs have been performed.   IMAGING: I personally reviewed the patient's film and she has a large pneumothorax with shifting to the left. Post chest tube x-ray shows expansion of the lung was some pulmonary edema.   ASSESSMENT AND PLAN: The patient is a pleasant 39 year old female with a tension pneumothorax. I have since placed a tube thoracostomy, which is without lung expansion, which I have described to her. _________.  Will potentially place on suction. We will potentially place on water seal in a couple of days to ensure expansion.        ____________________________ Si Raiderhristopher A. Terrisa Curfman, MD cal:cc D: 01/28/2013 16:16:39 ET T: 01/28/2013 17:03:10 ET JOB#: [id number]  cc: Cristal Deerhristopher A. Lovelle Lema, MD, <Dictator> Jarvis NewcomerHRISTOPHER A Jacksen Isip MD ELECTRONICALLY SIGNED 01/31/2013 13:22

## 2015-04-22 NOTE — Consult Note (Signed)
PATIENT NAME:  Kristen Villegas, Kristen Villegas MR#:  [id number] DATE OF BIRTH:  23-Nov-1976  DATE OF CONSULTATION:  04/14/2013  REFERRING PHYSICIAN:  Felipa Furnace, MD   CONSULTING PHYSICIAN:  Rosalyn Gess. Yaakov Saindon, MD  REASON FOR CONSULTATION: HIV infection with recent GI symptoms.   HISTORY OF PRESENT ILLNESS: The patient is a 39 year old white female with a past history of AIDS with a CD4 count of 16, recent PCP pneumonia with pneumothorax status post of several chest tubes, diabetes who was admitted on 04/11 with rapid onset of nausea, vomiting, diarrhea and dizziness. The patient states that she has been living in the homeless shelter and that similar illness has been going through the homeless shelter. She had rapid onset of the nausea, vomiting and diarrhea. She denies any fevers, chills or sweats. She denies any cough, shortness of breath or sputum production. She has had no significant anorexia, but she was feeling generally poorly and had some dizziness with standing. She was discharged from the hospital, after having a prolonged hospitalization for PCP pneumonia, on 03/18. She was to follow up with the [facility] and in my office within the week, but she did not show to either of these. She took the remainder of the antibiotics she was given but did not fill any of her medications due to cost. She has currently been living in the homeless shelter. On admission, she was started on fluconazole, vancomycin, azithromycin, Bactrim prophylaxis and meropenem. The vancomycin has been stopped, but all the other medications had continued, although she was switched from Zosyn to meropenem.    ALLERGIES: PENICILLIN.   PAST MEDICAL HISTORY: 1. AIDS with a CD4 count of 16.  2. Probable PCP pneumonia with development of a pneumothorax, treated with chest tube placement.  3. Status post pleurodesis x 2.  4. COPD.  5. Diabetes.   SOCIAL HISTORY: She is currently living in the homeless shelter. She has  no pets. She does not drink. No history of injecting drug use. She smokes 1/2 pack of cigarettes per day.   FAMILY HISTORY: Positive for diabetes and stroke.  REVIEW OF SYSTEMS:  GENERAL: No fevers, chills, or sweats. Some malaise. No myalgias. Some fatigue.  HEENT: No headaches. No sinus congestion. No sore throat.  Positive dizziness.  NECK: No stiffness, no swollen glands.  RESPIRATORY: No cough. No shortness of breath. No sputum production.  CARDIAC: She had some chest pains described in the H and P, but they are not currently present. No peripheral edema.  GASTROINTESTINAL: Positive nausea, vomiting and diarrhea, some anorexia.  GENITOURINARY: No complaints.  MUSCULOSKELETAL: No complaints.  SKIN: No rashes.  NEUROLOGIC: No focal weakness.  PSYCHIATRIC: No complaints.  All other systems were negative.   PHYSICAL EXAMINATION: VITAL SIGNS: T-max of 99.1, T-current of 98.2, pulse 92, blood pressure 104/67, 96% on room air.  GENERAL: A 40 year old thin white female in no acute distress.  HEENT: Normocephalic, atraumatic. Pupils are equal and reactive to light. Extraocular motion intact. Sclerae, conjunctivae, and lids are without evidence for emboli or petechiae. Oropharynx shows no erythema or exudate. Teeth and gums are in fair condition.  NECK: Supple. Full range of motion. Midline trachea. No lymphadenopathy. No thyromegaly.  LUNGS: Clear to auscultation bilaterally with good air movement. No focal consolidation.  HEART: Regular rate and rhythm without murmur, rub or gallop.  ABDOMEN: Soft, nontender and nondistended. No hepatosplenomegaly. No hernia is noted.  EXTREMITIES: No evidence for tenosynovitis.  SKIN: No rashes. No stigmata of endocarditis, specifically,  no Janeway lesions or Osler nodes.  NEUROLOGIC: The patient is awake and interactive, moving all 4 extremities.  PSYCHIATRIC: Mood and affect appeared normal.   LABORATORY AND RADIOLOGICAL DATA:  BUN 12, creatinine 0.48,  bicarbonate 27, anion gap of 3. LFTs are unremarkable. White count is 5.7 with a hemoglobin of 10.0, platelet count of 244, ANC 5.0. White count on admission was 5.7. Blood cultures from admission show no growth. A urinalysis was unremarkable.   A chest x-ray showed mild heterogeneous opacities in the periphery of the right lung which are new.  Other interstitial opacities are decreased.   IMPRESSION: A 39 year old female with a history of diabetes, and AIDS with a recent PCP pneumonia with pneumothorax, admitted with nausea, vomiting and diarrhea which have resolved.   RECOMMENDATIONS: 1. Her initial symptoms were GI in nature and have resolved. She has been living in the shelter and others in the shelter had a similar illness. No further GI work-up is needed.  2. She denies having any pulmonary symptoms. There is little to suggest a significant pneumonia.  Her chest x-ray has an infiltrate, but she has had chest tubes in place, and this could represent scarring, would repeat the chest x-ray tomorrow.  3. We will discontinue the meropenem.  4. We will change the azithromycin to p.o.  We will continue daily dosing until the chest x-ray returns, then return to weekly dosing for MAI prophylaxis.  5. Would continue trimethoprim/sulfamethoxazole for PCP prophylaxis.  6. She was noncompliant with being seen by me or in the [facility] due to cost. Similarly, she has not been taking any OI prophylaxis due to inability to afford her medicines. She was in the process of applying for Medicaid. Care Management is involved. She certainly is disabled, and without medication she will be either readmitted with another opportunistic infection or die in the near future.   7. Would hold on HAART as she has not been able to get OI prophylaxis, which is generic. She would certainly not be able to afford the more costly HAART medications.   This is a moderately complex infectious disease case.   Thank you very  much for involving me in this patient's care.    ____________________________ Rosalyn GessMichael E. Navy Belay, MD meb:cb D: 04/14/2013 19:21:30 ET T: 04/14/2013 20:28:18 ET JOB#: [id number]  cc: Rosalyn GessMichael E. Dayton Sherr, MD, <Dictator> Kendahl Bumgardner E Marzelle Rutten MD ELECTRONICALLY SIGNED 04/15/2013 9:06

## 2015-04-22 NOTE — Consult Note (Signed)
PATIENT NAME:  Kristen Villegas, [name] MR#:  [id number] DATE OF BIRTH:  04-26-1976  DATE OF CONSULTATION:  05/21/2013  REFERRING PHYSICIAN:  Dr. Sherryll BurgerShah CONSULTING PHYSICIAN:  Rosalyn GessMichael E. Jersi Mcmaster, MD  REASON FOR CONSULTATION:  AIDS.   HISTORY OF PRESENT ILLNESS: The patient is a 39 year old female with a past history significant for diabetes, AIDS, recent PCP pneumonia with pneumothorax who was admitted on 05/20 with nausea, vomiting, abdominal pain and fever. She had been admitted approximately a month ago with abdominal pain that was somewhat similar to this, but her symptoms resolved fairly quickly. She was discharged and was readmitted with 1 day of recurrence of her symptoms. The patient had been in jail at the time and was transferred from the jail to Emergency Room. She states that she had a temperature of 102 in the jail, but has been afebrile since being in the ER. Her blood pressure was somewhat low. Her LFTs were elevated and these were normal during her last hospitalization. She underwent ultrasound which showed multiple gallstones. She was seen by surgery who felt that the stones were unchanged and she did not have any acute evidence for cholecystitis. They do not feel surgery is necessary. She has been put on trimethoprim/sulfamethoxazole, azithromycin and continues on fluconazole. She is receiving treatment doses of the azithro and the trimethoprim/sulfamethoxazole. She was also started on metronidazole and given a dose of Levaquin initially. She states that she is feeling better. She still has some pain. She describes her pain as mainly in the left lower quadrant. It has been improving recently.   ALLERGIES:  INCLUDE PENICILLIN.   PAST MEDICAL HISTORY: 1.  AIDS with a CD4 count of 16. At the time of her last hospitalization, [facility] She went to that followup and was set up to start HAART. Two weeks later, however, she was subsequently arrested and was in jail  at the time. She missed her appointment but has rescheduled it for July. She is not currently on any antiretroviral therapy. She states that she has been taking her PCP and MAI prophylaxis, although she did not receive the MAI prophylaxis while incarcerated. She has been taking fluconazole as well.  2.  Probable PCP pneumonia with development of pneumothorax treated with chest tube placement, steroids and high-dose trimethoprim/sulfamethoxazole.  3.  Status post pleurodesis x 2.  4.  COPD. 5.  Diabetes.   SOCIAL HISTORY: She had been living in the homeless shelter recently, but was most recently in jail. She has no pets. She does not drink. No history of injecting drug use. She smoked half a pack of cigarettes per day.   FAMILY HISTORY:  Positive for diabetes and stroke.  REVIEW OF SYSTEMS:  GENERAL:  Positive fevers and malaise. No chills or sweats.  HEENT:  She has some headaches. No sinus congestion. No nasal congestion. No sore throat.  NECK:  No stiffness. No swollen glands.  RESPIRATORY:  No cough.  No shortness of breath. No sputum production.  CARDIAC:  No chest pains or palpitations.  GASTROINTESTINAL: Positive nausea and vomiting. Positive abdominal pain in left lower quadrant, occasional diarrhea.  GENITOURINARY:  No complaints.  MUSCULOSKELETAL:  No specific complaint. SKIN:  No rashes.  NEUROLOGIC:  No focal weakness.  PSYCHIATRIC:  No complaints.  All other systems are negative.   PHYSICAL EXAMINATION: VITAL SIGNS:  T-max of 100.9, T-current of 97.5, pulse 78, blood pressure 100/65, 98% on room air.  GENERAL: A 39 year old white female  in no acute distress.  HEENT: Normocephalic, atraumatic. Pupils equal, round and reactive to light. Extraocular motion intact. Sclerae, conjunctivae and lids are without evidence for emboli or petechiae. Oropharynx shows no erythema or exudate. Gums are in fair condition.  NECK:  Supple. Full range of motion. Midline trachea. No  lymphadenopathy. No thyromegaly.  CHEST:  Clear to auscultation bilaterally with good air movement. No focal consolidation.  CARDIAC:  Regular rate and rhythm without murmur, rub or gallop.  ABDOMEN: Soft, minimally tender in the left lower quadrant but no rebound or guarding. No hepatosplenomegaly. No hernia is noted.  EXTREMITIES:  No evidence for tenosynovitis.  SKIN:  No rashes. No stigmata of endocarditis; specifically, no Janeway lesions or Osler nodes.  NEUROLOGIC: The patient was awake and interactive, moving all 4 extremities.  PSYCHIATRIC:  Mood and affect appeared normal.   LABORATORY DATA: Shows a BUN of 15, creatinine 0.78, bicarbonate 22, anion gap of 7. LFTs from yesterday showed an AST of 165, ALT of 197, alkaline phosphatase 66, total bilirubin of 0.2. On admission her AST was 183 with an ALT of 224, alkaline phosphatase of 79 with a total bilirubin of 0.2. White count on admission was 6.0 with a hemoglobin of 11.4, platelet count of 131, ANC of 3.6. Blood cultures from admission show no growth. GC and chlamydia testing are negative. A wet prep showed evidence for trichomonas. Urinalysis showed essentially unremarkable. Urine culture is negative.  A chest x-ray showed no infiltrates.  An ultrasound of the abdomen showed multiple gallstones without evidence of acute cholecystitis. There was no Murphy's sign present.   IMPRESSION: A 39 year old female with a past history significant for diabetes, AIDS, recent pneumonia with pneumothorax, admitted with nausea, vomiting and diarrhea and transaminitis.   RECOMMENDATIONS: 1.  She was admitted about a month ago with similar symptoms, though her LFTs were normal at that time. She has cholelithiasis but surgery does not feel that surgery is indicated. 2.  She was seen at [facility] but missed her followup due to being in jail. She has a followup set up in July to start HAART.  3. I will provide current prophylaxis with  trimethoprim/sulfamethoxazole, azithro and fluconazole. These need to be at prophylaxis dose, not treatment dose.  4.  Her GI symptoms are much better. She would like to be sent home before the weekend as she has a disability appointment on Saturday. We will consider a HIDA scan to see if her symptoms are reproducible with gallbladder stimulation.  5.  Will defer to [facility] regarding starting HAART.  6.  Will use metronidazole to treat her trichomonas. Best way of treating this would be 2 grams x 1 which will be easier for compliance.  This is a moderately complex infectious disease consult.  Thank you much for involving me in Ms. Mendez's care.   ____________________________ Rosalyn Gess. Shakemia Madera, MD meb:ce D: 05/21/2013 16:16:59 ET T: 05/21/2013 17:38:18 ET JOB#: [id number]  cc: Rosalyn Gess. Nihar Klus, MD, <Dictator> Rozelia Catapano E Badr Piedra MD ELECTRONICALLY SIGNED 05/28/2013 19:20

## 2015-04-22 NOTE — Op Note (Signed)
PATIENT NAME:  Schwarting, Luvern R MR#:  [id number] DATE OF BIRTH:  [name]1977-06-05  DATE OF PROCEDURE:  01/28/2013  PREOPERATIVE DIAGNOSIS: Right tension pneumothorax.   PREOPERATIVE DIAGNOSIS: Right tension pneumothorax.   PROCEDURE PERFORMED: Placement of a tube thoracostomy, 28-French.   ANESTHESIA: 1 mg Versed, 15 mcg of fentanyl, 20 mL of 1% local with epinephrine.   SURGEON: Ida Roguehristopher Christien Frankl, MD   ESTIMATED BLOOD LOSS: 5 mL.  SPECIMENS: None.   INDICATIONS FOR PROCEDURE:  The patient is a pleasant 39 year old female who presented on January 18th with nausea, vomiting and shortness of breath as well as fevers. She,  throughout her hospital course, has been diagnosed with pneumonia  and newly diagnosed HIV.  She became acutely short of breath today and was brought to the intensive care unit and found to have a large pneumothorax which was spontaneous. I, thus, was consulted for chest tube placement.   DETAILS OF PROCEDURE: The procedure was performed under an emergency as the patient was hypoxic and potentially in need of intubation.  I proceeded to give her sedation and prep her chest in standard surgical fashion. An incision was made at her anterior axillary line at the level of her breast. I numbed that area with 1% lidocaine with epinephrine, approximately 20 mL, and then I made an incision transversely. I went down to a rib and used a Kelly hemostat to pop above a rib. I then placed a finger into her chest cavity, and when I removed my finger there was a large gust of air, and she appeared to breathe better.  I then placed a 28-French chest tube into the apex of her lung at 16 cm and sutured it in place with a 0 silk U-stitch. I then placed a dressing over the wound and performed an x-ray which showed good expansion of the lung with possible hypervascularity and post expansion edema. X-ray was performed which showed good expansion of the lung.   I then took all the dressings down. There  were no immediate complications. Needle, sponge, and instrument counts were correct at the end of the procedure.   ____________________________ Si Raiderhristopher A. Ziya Coonrod, MD cal:cb D: 01/28/2013 16:00:46 ET T: 01/28/2013 16:43:13 ET JOB#: [id number]  cc: Cristal Deerhristopher A. Arend Bahl, MD, <Dictator> Jarvis NewcomerHRISTOPHER A Koda Routon MD ELECTRONICALLY SIGNED 01/31/2013 13:21

## 2015-04-22 NOTE — Op Note (Signed)
PATIENT NAME:  Kristen CornfieldZEIGLER, Baylea R MR#:  [id number] DATE OF BIRTH:  [birth year]  DATE OF PROCEDURE:  03/06/2013  SURGEON:  Jasmine Decemberimothy E Oaks, M.D.   ASSISTANT:  None.   PREOPERATIVE DIAGNOSIS:  Recurrent pneumothorax.   POSTOPERATIVE DIAGNOSIS:  Recurrent pneumothorax.   OPERATION PERFORMED:  Chemical pleurodesis using 500 mg of doxycycline.   INDICATIONS FOR PROCEDURE:  The patient underwent talc pleurodesis for recurrent pneumothorax but has a persistent medially-based pneumothorax on her CT scan. This was drained with a percutaneous drain but still has an air leak, and she was offered the above-named procedure for definitive management. The indications and risks were explained and the patient gave her informed consent.   DESCRIPTION OF PROCEDURE:  The patient was brought to the Endoscopy Suite where she was properly identified and a time-out was performed. The proper patient and position was identified. The patient was then managed with intravenous sedation with Anesthesia present. The small pigtail catheter was prepped with alcohol and 100 mg of lidocaine were introduced into the pleural space. The lidocaine was allowed to circulate within the pleural space for approximately 10 minutes before adding the doxycycline. Then 500 mg of doxycycline were again instilled through the chest tube. Once this was complete, we then suspended the tubing of the chest tube above the patient's head so that the doxycycline would remain in the chest. However, there was sufficient mechanism for air escape from the chest. Throughout this time, the patient was monitored and Anesthesia provided appropriate sedation and analgesia. At the time of the completion of the procedure, the patient was stable, and she was then transported to the endoscopy holding area.   ____________________________ Sheppard Plumberimothy E. Thelma Bargeaks, MD teo:jm D: 03/07/2013 05:20:57 ET T: 03/07/2013 12:40:01 ET JOB#: [id number]  cc: Sheppard Plumberimothy E. Thelma Bargeaks, MD,  <Dictator> Jasmine DecemberIMOTHY E OAKS MD ELECTRONICALLY SIGNED 03/16/2013 7:53

## 2015-04-22 NOTE — Consult Note (Signed)
Impression: 39yo female w/ h/o DM, AIDS, recent PCP pneumonia with PTX admitted with nausea, vomiting and diarrhea and transaminitis.  She was admitted about a month ago with similar symptoms, although her LFTs were normal at that time.  She has cholelithiasis, but surgery does not feel that surgery is indicated. [facility], but missed her follow up due to being in jail.  She has a follow up set up in July to start HAART.         Will provide current prophylaxis with TMP/SMX, azithro and fluconazole. Her GI symptoms are much better.  She would like to be able to be sent home before the weekend as she has a disability appt on Sat.  Would consider HIDA scan to see if her symptoms are reproducible with gallbladder stimulation. [facility]. 6)         Will use metronidazole to treat her trichomonas.  Will give her metronidazole 2g x1.  Electronic Signatures: Meris Reede MPH, Rosalyn GessMichael E (MD) (Signed on 22-May-14 15:27)  Authored   Last Updated: 22-May-14 16:17 by Koni Kannan MPH, Rosalyn GessMichael E (MD)

## 2015-04-22 NOTE — Consult Note (Signed)
PATIENT NAME:  Kristen Villegas, Kristen Villegas MR#:  [id number] DATE OF BIRTH:  1976/09/18  DATE OF CONSULTATION:  05/21/2013  REFERRING PHYSICIAN:   CONSULTING PHYSICIAN:  Kelilah Hebard A. 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 She is a resident of a local jail. Her chest tube has been removed. She was admitted to the medical service with nausea, vomiting, and left lower quadrant abdominal pain. She was found to have gallstones on ultrasound. She has had a known history of gallstones dating back to at least 2013. These have been seen on at least 2 other CT scans. There is no evidence of biliary ductal dilatation. The patient denies any history of biliary colic symptoms. Currently, she is feeling better.  She denies any pain in the right upper quadrant. She has no further nausea and vomiting, is hungry, and would like to be discharged. Surgical services were asked to comment regarding the need for surgical intervention for gallstones.   ALLERGIES: PENICILLIN.   MEDICATIONS: Well documented in the chart.   PHYSICAL EXAMINATION: GENERAL: The patient is alert and oriented. Sclerae are anicteric.  LUNGS: Clear. Chest tube sites are well healed.  ABDOMEN: Soft and nontender. There is no Murphy sign. No tenderness. No hernia. No mass. No rebound. No peritoneal signs.  EXTREMITIES: Warm and well perfused.   LABORATORY AND DIAGNOSTICS: The patient has had hepatitis B and panels which were both negative. Glucose is 95. Bilirubin is 0.2, alkaline phosphatase 66, AST 165, and ALT 197. White count is 6, hemoglobin 11.4, and platelet count 131,000; normal differential. Blood cultures are negative. Urine culture unremarkable.   Review of x-rays is as described above.    IMPRESSION: Gallstones in a patient with human immunodeficiency virus and elevated liver function tests.   PLAN: I do not see any indication for cholecystectomy at this time. Liver function tests may be secondary to medication effect or human immunodeficiency virus. I do not think this represents biliary colic or biliary disease that requires cholecystectomy. We will gladly follow up with the patient in our office as needed. ____________________________ Redge GainerMark A. Egbert GaribaldiBird, MD mab:sb D: 05/21/2013 11:23:43 ET T: 05/21/2013 11:28:51 ET JOB#: [id number]  cc: Loraine LericheMark A. Egbert GaribaldiBird, MD, <Dictator> Raynald KempMARK A Reginae Wolfrey MD ELECTRONICALLY SIGNED 05/27/2013 12:15

## 2015-04-22 NOTE — Op Note (Signed)
PATIENT NAME:  Kristen CornfieldZEIGLER, Shakima R MR#:  [id number] DATE OF BIRTH:  06-18-1976  DATE OF PROCEDURE:  02/18/2013  SURGEON: Marcial Pacasimothy E. Thelma Bargeaks, MD  ASSISTANT: Si Raiderhristopher A. Lundquist, MD  PREOPERATIVE DIAGNOSIS: Spontaneous pneumothorax secondary to pneumocystis and human immunodeficiency virus with persistent air leak.   POSTOPERATIVE DIAGNOSIS: Spontaneous pneumothorax secondary to pneumocystis and human immunodeficiency virus with persistent air leak.   OPERATION PERFORMED:  1.  Preoperative bronchoscopy to assess endobronchial anatomy.  2.  Right thoracoscopy with biopsy of right middle lobe.  3.  Talc pleurodesis.  INDICATIONS FOR PROCEDURE: The patient is a 39 year old woman with recently diagnosed HIV infection and presumed pneumocystis infection resulting in a pneumothorax. She has had a chest tube positioned and this has been in place for a considerable period of time. She has had a persistent air leak. In addition, a recent CT scan showed the presence of interstitial lung disease and she was offered the above-named procedure for definitive diagnosis and treatment.   DESCRIPTION OF PROCEDURE: The patient was brought to the operating suite and placed in the supine position. General endotracheal anesthesia was given with a double-lumen tube. Preoperative bronchoscopy was carried out and was normal to the subsegmental levels bilaterally. There was no evidence of purulent secretions. The tube was properly positioned and then the patient was turned for a right thoracoscopy. The right right-sided endotracheal tube was clamped and the chest tube was removed. The patient was then prepped and draped in the usual sterile fashion. We began by making a port inferiorly. Through this, we placed a 15 mm port and the thoracoscope was introduced. Upon entering the chest, there were minimal adhesions. The lung itself was very firm and fibrotic. We began by making 2 additional ports, 1 superior to her prior chest  tube site and 1 posterior to the tip of the scapula. Through these 3 ports, we were able to see the lung and perform the biopsy. We could not identify the air leak from the lung itself. We therefore selected a generous portion of the anterior aspect of the right middle lobe and using the endoscopic stapler, we excised a portion of the right middle lobe. Hemostasis was complete. We then irrigated the chest and then insufflated 5 grams of sterile talc under direct visualization. A single chest tube was inserted through the most inferior port and brought out through a separate stab wound. It was positioned to the apex of the chest. The wounds were then all closed in multiple running layers of Vicryl. The skin was closed with nylon. The previous chest tube site was debrided, and the deeper wounds were closed with Vicryl and the skin was packed open.   On the back table, we opened the specimen. The staple line was removed. The remaining portion of the lung was sent for permanent pathology. The staple line and the associated lung tissue were divided into separate aliquots and sent for a variety of cultures including bacterial, fungal, yeast, mycobacterial, nocardia.   The wounds were then covered with sterile dressings. The patient was then turned into supine position where she was extubated and taken to the recovery room in stable condition.    ____________________________ Sheppard Plumberimothy E. Thelma Bargeaks, MD teo:jm D: 02/18/2013 17:20:36 ET T: 02/18/2013 19:02:55 ET JOB#: [id number]  cc: Marcial Pacasimothy E. Thelma Bargeaks, MD, <Dictator> Christopher A. Juliann PulseLundquist, MD Jasmine DecemberIMOTHY E Rosalina Dingwall MD ELECTRONICALLY SIGNED 02/23/2013 17:42

## 2015-04-22 NOTE — Consult Note (Signed)
Brief Consult Note: Diagnosis: right spontaneous pneumothorax.   Patient was seen by consultant.   Consult note dictated.   Discussed with Attending MD.   Comments: Presumed PCP with pneumothorax.  Currently on Prednisone for lung disease.  Unlikely that talc will be effective given steroid use.  Would continue chest tube and will work towards decreasing suction to water seal.  Electronic Signatures: Jasmine Decemberaks, Matisha Termine E (MD)  (Signed 03-Feb-14 16:12)  Authored: Brief Consult Note   Last Updated: 03-Feb-14 16:12 by Jasmine Decemberaks, Krishna Heuer E (MD)

## 2015-04-22 NOTE — Discharge Summary (Signed)
PATIENT NAME:  Kristen Villegas, Kristen Villegas MR#:  [id number] DATE OF BIRTH:  November 15, 1976  DATE OF ADMISSION:  01/17/2013 DATE OF DISCHARGE:  03/13/2013  ADMITTING DIAGNOSIS: Acute hypoxic respiratory failure.   DISCHARGE DIAGNOSES: 1.  Respiratory failure.  2.  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  3.  Spontaneous right pneumothorax, status post 2 chest tube placements, pleurodesis with  doxycycline x 2.  4.  Right-sided chest pain due to pleurodesis.  5.  Hypertension with opiates for pain control.  6.  HIV with CD4 count 16.  This is a new diagnosis. 7.  Sepsis due to pneumonia.  8.  Chronic obstructive pulmonary disease exacerbation.  9.  Urinary tract infection, Escherichia coli.  10.  Hyponatremia, questionable syndrome of inappropriate antidiuretic hormone.  11.  Constipation.  12.  Hypokalemia.  13.  Adjustment reaction.  14.  Anemia of chronic disease.   DISCHARGE CONDITION: Stable.   DISCHARGE MEDICATIONS: The patient is to continue: 1.  Acetaminophen oxycodone 325/10 mg 1 tablet every six hours.  2.  Acetaminophen oxycodone 325/5 one tablet every 6 hours as needed.  3.  Remeron 15 mg p.o. at bedtime.  4.  Lorazepam 0.5 mg every 8 hours as needed.  5.  Albuterol ipratropium CFC free 1 puff 4 times daily.  6.  Mupirocin 2% topical cream to affected areas every 4 hours as needed.  7.  Guaifenesin 600 mg p.o. twice daily.  8.  Senna 1 tablet twice daily.  9.  Docusate sodium 100 mg p.o. 2 capsules twice daily.  10.  Erythromycin 200 mg in 5 mL liquid 30 mL once a week.  11.  Sulfamethoxazole trimethoprim DS 1 tablet once a day.  12,  The patient was advised that bandage dry applied to the wound, clean and dry, remove dressing on Sunday, place gauze on the top of it and tape it.   FOLLOW UP:  With Dr. Thelma Barge in the next few days after discharge.  HOME OXYGEN:  None.  DIET: Regular, regular consistency.   ACTIVITY LIMITATIONS: As tolerated.  Referral to home health and home  care.   Follow-up appointment with Dr. Leavy Cella in 2 days after discharge as well as [facility] on 04/05/2013.  Please refer to interim discharge summary dictated by Dr. Auburn Bilberry on 03/06/2013.  HISTORY OF PRESENT ILLNESS:  The patient is 39 year old Caucasian female who presented to the hospital in January with acute respiratory failure, hypoxia as well as nausea and vomiting She was also complaining of weakness, shortness of breath, cough and mild fever requiring high flow nasal cannula oxygen.  She was placed on Levaquin, later on added vancomycin.  Because of COPD exacerbation, she was also started on IV steroids and nebulizers.   Consultation with Dr. Leavy Cella as well as with Dr. Belia Heman were obtained. Dr. Leavy Cella felt the patient has PCP pneumonia.  Her CD4 count was found to be 16.  Blood cultures were negative. Urine cultures revealed Escherichia coli. Sputum cultures showed Rhizobium.  She was continued on ceftriaxone as well as sulfamethoxazole initially, also with steroids for possible PCP.  With this, she improved, however, developed acute pneumothorax on the right side and required surgical intervention.  She had 2 chest tubes placed due to a moderate-sized air.    By the time I saw her on 03/07/2013, the patient had pleurodesis done with talc as well as doxycycline and she was continued on IV high doses of opiates because of significant pain and surgery followed the patient along  while in the hospital.  In regards to acute respiratory failure, as mentioned above, the patient's acute respiratory failure was very likely related to her PCP pneumonia.  The patient improved with IV Septra as well as steroids and she was eventually weaned off oxygen and by the day of time of discharge, she was on room air and her oxygen saturations were satisfactory. Her most recent chest x-ray showed no pleural effusion and both chest tubes were removed.  The patient was placed on an occlusive dressing. She  is to take this dressing off on Sunday in the next few days after discharge and follow up with Dr. Thelma Bargeaks in the next 2 to 3 days after discharge for further recommendations. In regards to right pneumothorax, as mentioned above, the patient had radiology guided catheter placed status post thoracentesis x 2.  The last one were done on 03/07/2013.  Pain control was a main issue for the patient; however, when the patient was started on Percocet 10 around the clock doses she felt better. She is to continue Percocet 10 as well as around-the-clock as well as Percocet 5 as needed. Fentanyl was discontinued. The patient is to follow up with Dr. Thelma Bargeaks for further recommendations.  Script for Percocet will be given for patient.  In regard to PCP pneumonia, the patient was treated with Septra for 21 days.   For her HIV, this was a new diagnosis.  The patient is to continue Bactrim for PCP prophylaxis as well as erythromycin for MIA prophylaxis.  As well, the patient's CD4 count was 16. The patient will likely need to be started on HRT therapy as outpatient; however, she is to follow up with Dr. Leavy CellaBlocker as outpatient for further recommendations.   The patient was noted to be hypotensive while in the hospital and over the past one week prior to discharge; it was felt to be due to due to association with the pain medications, opiates.  When those were weaned off or stopped, the blood pressure remained somewhat low but stable. We were able to take the patient off IV fluids.   For sepsis, that has resolved.   For COPD exacerbation this also has resolved and the patient was off steroids by the day of discharge.   In regards to coli urinary tract infection, as the E. coli was pansensitive, that was felt to be treated already with Septra, 21 days of Septra therapy.  For hyponatremia, it was felt that the patient's hyponatremia was likely due to Bactrim as well as possibly PCP. The patient was given IV fluids while in the  hospital. Her sodium level remained stable at improved to a normal level by the day of discharge.  It was 138 on 03/13/2013.   The patient was noted to be constipated in the hospital; however, with medications her constipation resolved. The patient is to continue medications while she is taking opiates for pain control.   The patient was noted to be hypokalemic; however, that also resolved.   The patient was somewhat agitated intermittently as well as uncomfortable, tearful.  She was consulted by Dr. Jennet MaduroPucilowska who felt that patient very likely has adjustment disorder with anxiety. Dr. Jennet MaduroPucilowska recommended to start the patient on Remeron at nighttime for anxiety, depression as well as her sleep and appetite. She recommended also to continue Ativan for now but felt that this is not a long-term solution especially since the patient does not have health insurance.  The patient is to follow up to establish  primary care physician at the [facility] and make decisions about her medications in the future.   For anemia of chronic disease, the patient's anemia was stable over the past 1 week.  On 03/12/2013 the patient's hemoglobin level was 8.0. It is recommended again to follow the patient's hemoglobin level to ensure the patient symptomatic and not worsening.  Her iron studies were checked on 03/12/2012.  Her iron in serum level was as low at 32. The patient's iron-binding capacity was 108.  The patient's iron saturation was 29 and the patient's ferritin level was 422. It was felt the patient had anemia of chronic disease   The patient is being discharged in stable condition with the above-mentioned medications and follow-up. Her vital signs on the day of discharge: Temperature 98.3, pulse 110, respiratory rate was 20, blood pressure ranging  90 100 systolic and 60s to 70s diastolic and oxygen saturation was 91% on room air at rest as well as on exertion.   Time spent:  40  minutes.    ____________________________ Katharina Caper, MD rv:ct D: 03/16/2013 19:25:00 ET T: 03/17/2013 08:30:14 ET JOB#: [id number]  cc: Katharina Caper, MD, <Dictator> Timothy E. Thelma Barge, MD [facility]  Tanelle Lanzo MD ELECTRONICALLY SIGNED 04/02/2013 14:32

## 2015-04-22 NOTE — Discharge Summary (Signed)
PATIENT NAME:  Kristen Villegas, Kristen Villegas MR#:  [id number] DATE OF BIRTH:  [birth year]  DATE OF ADMISSION:  04/10/2013 DATE OF DISCHARGE:  04/16/2013  ADMITTING DIAGNOSIS: Shortness of breath, cough.   DISCHARGE DIAGNOSES:  1.  Acute respiratory failure felt to be due to possible systemic inflammatory response syndrome as a result of pneumonia, seen by infectious disease. They were not very impressed with the diagnosis of pneumonia, did not feel that she had pneumocystis carinii pneumonia. The patient's shortness of breath has resolved.  2.  Chest wall pain, likely felt to be due to mononeuropathy after surgery. Symptoms improved.  3.  Acute chronic obstructive pulmonary disease exacerbation. Currently symptoms are significantly improved.  4.  Human immunodeficiency virus with CD4 count of 1.6, not on HAART. The patient will be followed up at [facility] next week.  5.  Hypotension, felt to be due to possible systemic inflammatory response syndrome, resolved with intravenous fluids.  6.  Thrush.  7.  Urinary tract infection. Cultures negative.  8.  Anemia of chronic disease.  9.  Recent diagnosis of pneumothorax, spontaneous due to pneumocystis carinii pneumonia, status post pleurodesis with doxycycline x 2.  10.  Possible syndrome of inappropriate antidiuretic hormone secretion during previous hospitalization.  11.  Adjustment reaction.  12.  Diabetes, diet controlled.   CONSULTANTS: Dr. Leavy Cella.   PERTINENT LABORATORIES AND EVALUATIONS: Admitting chest x-ray showed new heterogeneous opacity in the right mid lung. Troponin was less than 0.02. LDH was 203. Blood cultures x 2: No growth. Urinalysis showed 2+ leukocytes, WBCs 59. BMP: Glucose 95, BUN 6, creatinine 0.67, sodium 136, potassium 3.2, chloride 102, CO2 of 28, calcium 8.9. LFTs: Total protein 9.1, albumin 3.2. WBC 5.7, hemoglobin 11, platelet count 222. ABG with pH of 7.50, pCO2 of 32, pO2 of 79. Sputum culture appears to be normal flora,  some yeast.   HOSPITAL COURSE: Please refer to H and P done by the admitting physician. The patient is a 40 year old Caucasian female who had prolonged hospitalization for about 2-1/2 months in the hospital after she was newly diagnosed with HIV PCP pneumonia with spontaneous pneumothorax, who was discharged to a shelter, who presented back with complaint of shortness of breath. She was initially thought to have SIRS syndrome. The patient was admitted and placed on IV antibiotics to cover for initially PCP pneumonia, which was felt to be unlikely per ID. She also was treated for a community-acquired pneumonia. Slowly her respiratory status has significantly improved. The patient was doing well, but she was held due to concern about her followup. Dr. Leavy Cella was able to arrange an ID followup at [facility] on April 22, where they will be able to get HIV medications as an outpatient. At this time, the patient is currently stable for discharge to home.   DISCHARGE MEDICATIONS: Albuterol/ipratropium 1 puff inhalation 4 times a day, Percocet 325/10 mg 1 tab p.o. q.6 p.r.n. pain, mirtazapine 15 mg at bedtime, Diflucan 150 mg 1 tab p.o. daily x 3 days, Nystatin swish and swallow q.6, lorazepam 0.5 mg 1 tab p.o. q.8 p.r.n. anxiety, azithromycin 250 p.o. daily, Bactrim 1 tab p.o. daily, Spiriva 1 inhalation daily.   DIET: Low-sodium.   ACTIVITY: As tolerated.   FOLLOWUP: Follow up with the [facility] on April 22 at [facility]. Follow up with the [facility] in 1 to 2 weeks.   TIME SPENT: 35 minutes spent on the discharge.   ____________________________ Lacie Scotts. Allena Katz, MD shp:jm D: 04/16/2013 15:49:52 ET T:  04/16/2013 18:02:10 ET JOB#: [id number]  cc: Sade Mehlhoff H. Allena KatzPatel, MD, <Dictator> Charise CarwinSHREYANG H Eliot Popper MD ELECTRONICALLY SIGNED 04/26/2013 13:03

## 2015-04-22 NOTE — Consult Note (Signed)
Brief Consult Note: Diagnosis: HIV, elevated LFT's, gallstones,  no clinical evidence that this is cholecystitis.   Patient was seen by consultant.   Consult note dictated.   Discussed with Attending MD.   Comments: I see no reason to pursue CCY at this time.  Electronic Signatures: Natale LayBird, Suzan Manon (MD)  (Signed 22-May-14 10:39)  Authored: Brief Consult Note   Last Updated: 22-May-14 10:39 by Natale LayBird, Danahi Reddish (MD)

## 2015-04-22 NOTE — Consult Note (Signed)
PATIENT NAME:  Kristen Villegas, Kristen Villegas MR#:  [id number] DATE OF BIRTH:  04/23/1976  DATE OF CONSULTATION:  03/12/2013  REFERRING PHYSICIAN:  Dr. Katharina Caperima Vaickute. CONSULTING PHYSICIAN:  Jolanta B. Pucilowska, MD  REASON FOR CONSULTATION:  To evaluate a patient with uncontrollable pain.   IDENTIFYING DATA:  The patient is a [age] year old female with no past psychiatric history.   CHIEF COMPLAINT:  "I feel so much better now".   HISTORY OF PRESENT ILLNESS:  I was asked by Dr. Mare FerrariLavine to evaluate the patient who was newly diagnosed with HIV and had multiple complications. She had recently had pneumothorax and has been in excruciating pain and no amount of pain medication was able to sooth her pain. There was thinking that maybe the patient has an underlying psychological condition. by the time I saw the patient, she is feeling much better, she is no longer in pain as both of her chest tubes were removed and her pain is well-controlled with Percocet now. She reports some anxiety for which she is treated with Ativan with great success. She denies symptoms of depression or anxiety, she feels that it is natural that she is slightly anxious and uncertain about her future, given her new diagnosis and all medical complications but after the tubes were removed and she is no longer in pain, she feels rather optimistic about the future. She has an excellent report with Dr. Leavy CellaBlocker and is committed to participate in her treatment. She denies psychotic symptoms, denies symptoms suggestive of bipolar mania. She denies alcohol or illicit substance use.   PAST PSYCHIATRIC HISTORY:  None. There were no suicide attempts or hospitalizations.   FAMILY PSYCHIATRIC HISTORY:  None reported.   PAST MEDICAL HISTORY:  Acute respiratory failure, right pneumothorax, PCP pneumonia, new diagnosed HIV, COPD.   ALLERGIES:  PENICILLIN.  MEDICATIONS AT THE TIME OF CONSULTATION:  Zofran as needed, Azithromycin 1200 mg weekly, Septra DS 1  tablet daily, Colace 200 mg twice daily, Mucinex 600 mg twice daily, Ativan 0.5 mg every 8 hours as needed, Senna 1 tablet twice daily, Lovenox daily, albuterol inhaler as needed, morphine injection as needed, Nystatin suspension, Percocet 10/325 every 6 hours, and Percocet 5/325 every 4 hours as needed for pain.   SOCIAL HISTORY:  The patient used to live with a friend. She lost her job and they were unable to pay the rent, she is evicted. She is homeless at the moment. No insurance. No family support. She applied for Medicaid and disability.  REVIEW OF SYSTEMS:  CONSTITUTIONAL:  No fevers or chills. Positive for fatigue and weight loss.  EYES:  No double or blurred vision.  ENT:  No hearing loss. RESPIRATORY:  Positive for some shortness of breath.  CARDIOVASCULAR:  No chest pain or orthopnea.  GASTROINTESTINAL:  No abdominal pain, nausea, vomiting, or diarrhea.  GENITOURINARY:  No incontinence or frequency.  ENDOCRINE:  No heat or cold intolerance.  LYMPHATIC:  No anemia or easy bruising.  INTEGUMENTARY:  No acne or rash.  MUSCULOSKELETAL:  No muscle or joint pain.  NEUROLOGIC:  No tingling or weakness.  PSYCHIATRIC:  See history of present illness for details.   PHYSICAL EXAMINATION:  VITAL SIGNS:  Blood pressure 98/65, pulse 86, respirations 20, temperature 98.  GENERAL:  This is a slender female in no acute distress. The rest of the physical examination is deferred to her primary attending.   LABORATORY DATA:  Chemistries are within normal limits. LFTs within normal limits. CBC shows severe anemia with a  hemoglobin of 8.   MENTAL STATUS EXAMINATION:  The patient is alert and oriented to person, place, time, and situation. She is pleasant, polite and cooperative. She is in good spirits. She is in bed wearing hospital gown. She maintains good eye contact. Her speech is of normal rhythm, rate, and volume. Mood is fine with a full affect. Thought processing is logical and goal oriented.  Thought content:  She denies suicidal or homicidal ideation. There are no delusions or paranoia. There are no auditory or visual hallucinations. Her cognition is grossly intact. Her insight and judgment are fair.   SUICIDE RISK ASSESSMENT:  This is a patient with new diagnosed HIV and multiple medical complications of it who is accepting her diagnosis and believes that she can live a long life as long as she is compliant with treatment.   DIAGNOSES:  AXIS I:  Adjustment disorder with anxiety.  AXIS II:  Deferred.  AXIS III:  HIV, pneumothorax, PCP pneumonia, urinary tract infection.  AXIS IV:  New onset medical illness, homelessness, employment, financial, access to care, primary support.  AXIS V:  GAF 45.   PLAN: 1.  We will start Remeron at night for anxiety, depression, sleep and appetite.  2.  The patient is on low dose Ativan. I would continue it for now.   I will follow along.   ____________________________ Ellin Goodie. Pucilowska, MD jbp:jm D: 03/13/2013 18:45:02 ET T: 03/14/2013 10:40:20 ET JOB#: [id number]  cc: Jolanta B. Jennet Maduro, MD, <Dictator> Shari Prows MD ELECTRONICALLY SIGNED 03/29/2013 10:39

## 2015-04-22 NOTE — H&P (Signed)
PATIENT NAME:  Kristen Villegas, Kristen Villegas MR#:  [id number] DATE OF BIRTH:  Aug 15, 1976  DATE OF ADMISSION:  04/10/2013  PRIMARY CARE PHYSICIAN:  [facility].   SURGEON:  Dr. Thelma Barge.   INFECTIOUS DISEASE:  Dr. Leavy Cella.   HISTORY OF PRESENT ILLNESS:  The patient is a 39 year old Caucasian female with past medical history significant for history of recent diagnosis of HIV during past admission to [facility] which lasted approximately 2-1/2 months and was discharged of patient on 03/17/2013 comes back to the hospital with complaints of not feeling well.  According to patient, she was doing well up until approximately 1-1/2 weeks ago when she moved to the shelter.  She started having problems with right-sided chest pains.  She has been growing more fatigued and weak.  She is dizzy.  She cannot stand up.  She would become really dizzy.  She also started noticing thrush in her mouth.  She has significant odynophagia with pain with swallowing, not able to drink or eat well.  She has been noticing also right-sided chest pains which she is not able to describe much.  She has been coughing with dry cough.  No significant phlegm production.  She is also short of breath.  She has been short of breath since last week.  Her O2 sat last week were around 92%.  Today on arrival to Emergency Room, her O2 sats are satisfactory however, her heart rate is intermittently as high, according to patient that is 125.  Here in the hospital it was above 110.  Because of chest pains as well as shortness of breath she underwent a repeated x-ray here in the Emergency Room where she was noted to have new development of suspected pneumonia and hospitalist services were contacted for admission.  The patient has not been taking her Septra DS or azithromycin for the past 3 weeks due to inability to get medications.  The patient has not also followed up with Dr. Thelma Barge or Dr. Leavy Cella.   PAST MEDICAL HISTORY:   1.  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  2.  Spontaneous right pneumothorax, status post two chest tube placements.  3.  Pleurodesis with doxycycline x 2.  4.  Right-sided chest pain due to pleurodesis.  5.  Hypotension with opiates for pain control.  6.  HIV with CD4 count of 16.  7.  Sepsis due to pneumonia.  8.  COPD exacerbation. 9.  Urinary tract infection.  10.  E. coli.  11.  Hyponatremia.  12.  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  13.  Constipation.  14.  Hypokalemia.  15.  Adjustment reaction.  16.  Anemia of chronic disease.   17.  Diabetes, diet-controlled.  MEDICATIONS:  The patient was discharged on acetaminophen oxycodone 325/10 1 tablet every 6 hours as needed, acetaminophen oxycodone 325/5 1 tablet every 6 hours as needed, Remeron 15 mg by mouth at bedtime, lorazepam 0.5 mg every 8 hours as needed, albuterol ipratropium CFC-free 1 puff 4 times daily, mupirocin 2% topical cream to affected areas every 4 hours as needed, guaifenesin 600 mg by mouth twice daily, senna 1 tablet twice daily, docusate sodium 100 mg by mouth 2 capsules twice daily, Azithromycin 1200 mg, once weekly, sulfamethoxazole trimethoprim DS 1 tablet once a day.  The patient is not getting her azithromycin or sulfamethoxazole trimethoprim for the past three weeks.  She apparently was given one week supply by  our social workers here in the hospital and then she was not able to get to [facility].   PAST SURGICAL HISTORY:  As above.   ALLERGIES:  PENICILLIN WHICH GIVES HER SWELLING.   FAMILY HISTORY:  Diabetes as well as stroke in the patient's mother.   SOCIAL HISTORY:  A 1/2 pack of tobacco per week.  No drugs.  No alcohol, according to history on patient's chart.   REVIEW OF SYSTEMS:   Admits of having fatigue and weakness, dizziness whenever she stands up.  Pains in her right chest as well as pains with  swallowing, thrush, inability to swallow, poor by mouth intake, cough with no phlegm, some shortness of breath, mild hypoxia last week to 92% on room air.  She was with room air oxygenation, 96% on room air on exertion, also chest pains in the site of incision on the right side of the chest, some nausea as well as vomiting x 2 today.  Otherwise:   CONSTITUTIONAL:  Denies any fevers, weight loss or gain.  EYES:  Denies any blurry vision, double vision, glaucoma, or cataracts.   EARS, NOSE, THROAT:  Denies any tinnitus, allergies, epistaxis, sinus pain, dentures.  Admits to difficulty swallowing.  RESPIRATORY:  Denies any wheezes, hemoptysis, painful respirations.  CARDIOVASCULAR:  Denies any orthopnea, edema, arrhythmias, _____ or syncope.  GASTROINTESTINAL:  Denies any diarrhea or constipation.  GENITOURINARY:  Denies dysuria, hematuria, frequency, or incontinence.  ENDOCRINOLOGY:  Denies any polydipsia, nocturia, thyroid problems, heat or cold intolerance, or thirst.  HEMATOLOGIC:  Denies anemia, easy bruising, bleeding or swollen glands.  SKIN:  Denies any acne, rashes or change in moles.  MUSCULOSKELETAL:  Denies arthritis, cramps, swelling, gout.  NEUROLOGIC:  No numbness, epilepsy, tremors. PSYCHIATRIC:  Denies anxiety or insomnia.   PHYSICAL EXAMINATION: VITAL SIGNS:  On arrival to the hospital, temperature 97.2, pulse 112, respirations was 18, blood pressure 96/64, saturation was 99% on room air.  GENERAL:  This is a well-nourished thin pale Caucasian female in mild distress secondary to pain as well as overall discomfort lying on the stretcher.  HEENT:  Her pupils are equal, reactive to light.  Extraocular movements intact.  No icterus or conjunctivitis.  Has normal hearing.  No pharyngeal erythema, however patient does have significant yellowish nasty-looking and poorly foul-smelling mouth with yellow coating on her tongue.  NECK:  Did not reveal any masses.  Supple, nontender.  Thyroid  not enlarged.  No adenopathy.  No JVD or carotid bruits bilaterally.  Full range of motion.  LUNGS:  Unremarkable.  Good breath sounds on the left.  Diminished breath sounds especially at bases with wheezing as well, posteriorly as well as inferiorly on the right side.  No rales or rhonchi.  The patient does have diminished breath sounds and wheezing and labored inspirations, increased effort to breathe.  She is in mild respiratory distress, tachypneic.  CARDIOVASCULAR:  S1, S2 appreciated.  No murmurs, gallops or rubs noted.  PMI not lateralized.  Chest is nontender to palpation.  Tachycardic.  1+ pedal pulses.  No lower extremity edema.  _____. ABDOMEN:  Soft, nontender.  Bowel sounds are present.  No hepatosplenomegaly or masses were noted.  RECTAL:  Deferred.  MUSCLE STRENGTH:  Able to move all extremities.  No cyanosis or degenerative joint disease or kyphosis.  Gait is not tested.  SKIN:  Did not reveal any rashes, lesions, erythema, nodularity or induration.  The patient does have a few incision sites in the right  thorax.  One of them in the lower thoracic, anterior axillary area and the lower chest still has a few sutures.  Skin otherwise is warm and dry to palpation.  LYMPHATIC:  No adenopathy in cervical region.  NEUROLOGIC:  Cranial nerves grossly intact.  Sensory is intact.  No dysarthria or aphasia.  The patient is alert, oriented to time, person, place, cooperative.  Memory is good.  No significant confusion, agitation, depression.   LABORATORY, DIAGNOSTIC AND RADIOLOGICAL DATA:  BMP showed glucose of 95, potassium 3.2, otherwise BMP was unremarkable.  The patient's albumin level was 3.2, total bilirubin was 9.1.  White blood cell count is 5.7, hemoglobin 11.0, platelet count 222, absolute neutrophil count is 3.9.  Urinalysis, yellow clear urine, negative for glucose, bilirubin or ketones, specific gravity 1.010, pH was 7.0, negative for blood, protein, nitrites, 2+ leukocyte esterase, 1 red  blood cell, 59 white blood cells, trace bacteria, less than 1 epithelial cell was noted.  Chest x-ray, PA and lateral, 04/10/2013, revealed new heterogeneous opacities in the right mid lung which may represent infection.  Clinical correlation was recommended.  Follow-up PA and lateral chest radiographs were recommended according to radiologist.   ASSESSMENT AND PLAN: 1.  Systemic inflammatory response reaction. Admission to medical floor.  Get blood cultures as well as sputum cultures and urine cultures.  Start the patient on Septra IV, also meropenem, Zithromax as well as vancomycin.  Get sputum cultures if possible.  Dr. Sharrell KuBlocker's consultation again and initiate steroids IV.  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  _____ IV and Solu-Medrol.  2.  Chronic obstructive pulmonary disease exacerbation.  Steroids, inhalation therapy as well as nebulizers.  3.  Questionable urinary tract infection.  Get urine cultures and start antibiotic therapy.  4.  Thrush.  Diflucan IV, full liquid diet as well as nystatin orally.   TIME SPENT:  1 hour.    ____________________________ Katharina Caperima Marquel Spoto, MD rv:ea D: 04/10/2013 20:13:08 ET T: 04/10/2013 23:03:08 ET JOB#: [id number]  cc: Katharina Caperima Nitika Jackowski, MD, <Dictator> [facility] Minsa Weddington MD ELECTRONICALLY SIGNED 05/03/2013 18:00

## 2015-04-22 NOTE — H&P (Signed)
PATIENT NAME:  Kristen Villegas, Kristen Villegas MR#:  [id number] DATE OF BIRTH:  11-29-1976  DATE OF ADMISSION:  01/17/2013  REFERRING PHYSICIAN: Dr. Darnelle CatalanMalinda.   CHIEF COMPLAINT: Nausea, vomiting and shortness of breath.   PRIMARY CARE PHYSICIAN: None.   HISTORY OF PRESENT ILLNESS: The patient is a pleasant 39 year old Caucasian female with history of diabetes controlled with diet, who presents with the above chief complaint. The patient stated that she has been having some shortness of breath and off and on fevers for the past week or so, worse in the last several days. The patient has had fever of up to 102. The patient and her boyfriend, who is also in the room, is somewhat inconsistent with their history. The patient initially said that she had a fever of 102 this morning, but later stated that actually she has no fever and she took an aspirin and the fever was yesterday. The shortness of breath was somewhat progressive, worse in the last couple of days. Here, she was noted to have initial O2 sat of 84%. She has been having decreased p.o. intake, nausea, vomiting, and diarrhea. The diarrhea was with p.o. intake. She also had a positive UA here on arrival. She was relatively hypotensive as well requiring fluid boluses. Hospitalist services were contacted for further evaluation and management.   PAST MEDICAL HISTORY: Diabetes, diet controlled.   PAST SURGICAL HISTORY: Denies.  ALLERGIES: PENICILLIN CAUSING SWELLING.   FAMILY HISTORY: Diabetes and stroke in the mother.   SOCIAL HISTORY: 1/2 pack of tobacco per week. No drugs. No alcohol.   REVIEW OF SYSTEMS:   CONSTITUTIONAL: Positive for fever and global weakness.  EYES: No blurry vision or double vision.  ENT: No tinnitus, hearing loss or snoring.  RESPIRATORY: Positive for cough and shortness of breath. Cough is productive with yellowish-green sputum production yesterday.  CARDIOVASCULAR: Pleuritic chest pain with deep respirations. No orthopnea. No  swelling in the legs. No syncope.  GASTROINTESTINAL: Positive for nausea, vomiting, and loose stools. Some epigastric abdominal pain. No melena or rectal bleeding.  GENITOURINARY: She has polyuria. No dysuria or hematuria.  HEMATOLOGY/LYMPHATICS: No anemia or easy bruising.  SKIN: No new rashes.  MUSCULOSKELETAL: Denies arthritis.  NEUROLOGIC: Some global weakness. No focal weakness. No stroke or TIA.  PSYCHIATRIC: No anxiety or insomnia.   PHYSICAL EXAMINATION:  VITAL SIGNS: Temperature on arrival was 98.5, pulse 114, respiratory rate 20, blood pressure 96/61 and O2 sat was 84% on room air.  GENERAL: The patient is a Caucasian female lying in bed, appears older than recorded age.  HEENT: Normocephalic, atraumatic. Pupils are equal and reactive. Dry mucous membranes.  NECK: Supple. No thyroid tenderness. No cervical adenopathy.  CARDIOVASCULAR: S1, S2 regular rate and rhythm. No murmurs, rubs, or gallops.  LUNGS: Bilateral crackles in the bases bilaterally. Good air entry otherwise.  ABDOMEN: Positive for tenderness in the epigastric area. Hyperactive bowel sounds in all quadrants. No organomegaly appreciated.  EXTREMITIES: No significant lower extremity edema.  SKIN: No obvious rashes.  NEUROLOGIC: Cranial nerves II through XII grossly intact. Strength is 5 out of 5 in all extremities. Sensory is intact to light touch.  PSYCHIATRIC: Awake, alert, oriented x 3. Somewhat flat affect. Cooperative and conversant.   LABORATORY DATA: BNP is 150. Glucose 107. Pregnancy test was negative. Creatinine 0.78, sodium 134, potassium 2.7. The patient has some chronic hypokalemia. Chloride 99, CO2 27. Lipase 311. LFTs: Phosphorus 59, albumin 2.7, AST 64 (it was higher in October 2013 being 114) ALT 13. Troponin  negative. WBC 6.7, hemoglobin 11.2, platelets 288. UA: 3+ leukocyte esterase, 768 WBCs, 2+ bacteria and there were WBC clumps and some hyaline casts. EKG: Normal sinus rhythm, rate was 90. No acute ST  elevations or depressions. CT of abdomen and pelvis with IV contrast: There is no official read, but preliminary reading abnormal findings of the lung bases with findings compatible with severe CHF and pulmonary edema, ARDS-type pattern, stable splenomegaly and cholelithiasis. A 3.3 cm ovary cyst. No evidence of acute abdomen or pelvis abnormality.   ASSESSMENT AND PLAN: We have a 39 year old Caucasian female with diabetes, which is diet controlled, who presents with acute hypoxic respiratory failure with oxygen saturations of 84% on arrival. The radiologist is unclear about the patient possibly having pneumonia, but the patient has no fever or leukocytosis. This preliminary CT of the abdomen and pelvis shows abnormalities in the lungs, which are more of congestive heart failure or pulmonary edema related; however, this also is a bit confusing as the patient has a BNP, which is not elevated and she does not appear to be volume overloaded on exam. Given the patient's recent respiratory symptoms of productive cough, pleuritic chest pain and fevers, the most likely etiology is pneumonia. I would start the patient on Levaquin. I would obtain CM cultures. Would also obtain a rapid flu as the patient might have influenza. I will start the patient on oxygen. The patient has no significant fever however and has no significant body aches and no recent sick contacts per patient. This makes influenza less of an etiology at this point. The patient furthermore does not appear to be septic looking at this point. We will follow blood cultures and sputum cultures. The patient has hypokalemia, which we would replete intravenous n.p.o. The patient did have relative hypotension on arrival. We will continue some intravenous fluid at this point. The patient has a positive urinalysis and would start the patient on Levaquin as above and follow with the cultures. The nausea and vomiting, possibly is secondary to pneumonia as well.  Alternatively, she could be having viral gastroenteritis. CT of the abdomen and pelvis did not pick up any acute abdomen or pelvic abnormalities, however. We would monitor her symptoms and start her on Zofran p.[email or web address].n. The patient does smoke tobacco and she was counseled for 3 minutes.   TOTAL TIME SPENT: 50 minutes.   CODE STATUS: The patient is full code.   ____________________________ Krystal Eaton, MD sa:aw D: 01/17/2013 21:35:52 ET T: 01/18/2013 09:37:32 ET JOB#: [id number]  cc: Krystal Eaton, MD, <Dictator> Krystal Eaton MD ELECTRONICALLY SIGNED 03/09/2013 13:27

## 2015-04-22 NOTE — Consult Note (Signed)
Brief Consult Note: Diagnosis: Adjustment disorder with anxiety.   Patient was seen by consultant.   Consult note dictated.   Recommend further assessment or treatment.   Orders entered.   Discussed with Attending MD.   Comments: Ms. Kristen Villegas has no psychiatric history. [place] of newly diagnosed [place] infection. Initial consult was to address intractable pain. The patient feels much better today after the tubes were removed. Pain is well controlled with percocet, anxiety with Ativan. She denies symptoms of depression. She complains of poor sleep and decreased appetite.   PLAN: 1. Will start Remeron at night for anxiety, depression, sleep and appetite.  2. Will continue ativan for now but it is not a long time solution especially that there is no health insurance or income.   3. I will follow along.  Electronic Signatures: Kristine LineaPucilowska, Armany Mano (MD)  (Signed 13-Mar-14 18:09)  Authored: Brief Consult Note   Last Updated: 13-Mar-14 18:09 by Kristine LineaPucilowska, Salim Forero (MD)

## 2015-04-22 NOTE — Discharge Summary (Signed)
PATIENT NAME:  Kristen Villegas, Kristen Villegas MR#:  [id number] DATE OF BIRTH:  06-02-1976  DATE OF ADMISSION:  05/19/2013 DATE OF DISCHARGE:  05/22/2013  PRIMARY CARE PHYSICIAN:  Dr. Leavy Cella.  DISCHARGE DIAGNOSES:  1.  Acute cholecystitis with cholelithiasis.  2.  Elevated liver function tests.  3.  AIDS. 4.  Bacteria vaginosis along with trichomonas.  5.  Diabetes 2.   CONDITION:  Stable.   CODE STATUS:  FULL CODE.   HOME MEDICATIONS:  1.  Buspirone 10 mg by mouth tablets twice daily.  2.  Zithromax 200 mg per 5 mL oral liquid, 30 mL by mouth once a day.  3.  Cipro 500 mg by mouth twice daily for seven days.  4.  Percocet 325 mg/5 mg by mouth every 6 hours as needed for three days.  5.  Bactrim 1 tablet once a day.  6.  Fluconazole 100 mg by mouth once daily for 10 days.   DIET:  Low-fat, low-cholesterol, ADA diet.   ACTIVITY:  As tolerated.   FOLLOW-UP CARE:  Follow with Dr. Leavy Cella within one week.  The patient also needs to follow up with [facility] to start HAART.  In addition, the patient needs follow up with Dr. Egbert Garibaldi for cholecystitis for possible surgery in the future.   REASON FOR ADMISSION:  Nausea, vomiting and fever.   HOSPITAL COURSE:   1.  The patient is a 39 year old Caucasian female with a history of AIDS, not on HAART, presented to the ED with nausea, vomiting and fever.  The patient is currently on prophylaxis for PCP pneumonia.  The patient had a fever of 102, blood pressure was 80/54 in the ED.  She was found to have elevated liver function test.  For detailed history and physical examination, please refer to the admission note dictated by Dr. Enedina Finner.  Laboratory data showed wet prep was positive for trichomonas and clue cells.  Chest x-ray showed no acute cardiopulmonary abnormality.  The patient's WBC was 6.0, hemoglobin 11.4, SGPT 224, SGOT 183, bilirubin 0.2, sodium 132, LDH 224.  Urinalysis was negative for UTI.  The patient was admitted for elevated liver function tests  with nausea, vomiting, suspect cholecystitis with cholelithiasis as noted on the ultrasound of the abdomen and so the patient was kept nothing by mouth with IV fluid support after admission.  Dr. Egbert Garibaldi evaluated patient, suggested no indication for surgery at this time, so patient was started on full liquid and then advanced to regular diet.  The patient tolerated the diet well.  No more abdominal pain, nausea and vomiting.  2.  For oral thrush, the patient was treated with Diflucan 100 mg by mouth daily.  3.  For bacterial vaginosis along with trichomonas, the patient was treated with Flagyl 2 gram, one dose, according to Dr. Sharrell Ku recommendation.  4.  For AIDS, the patient was not on active antiviral therapy.  Dr. Leavy Cella suggested continuing Bactrim for pneumocystis prophylaxis.  The patient needs to follow up with [facility] to start HAART.   5.  For diabetes, the patient was treated with sliding scale.    The patient's symptoms has much improved.  Vital signs are stable.  The patient was clinically stable.  She was discharged to home yesterday.  Discussed the patient's discharge plan with the patient's case manager.   TIME SPENT:  About 35 minutes.    ____________________________ Shaune Pollack, MD qc:ea D: 05/23/2013 15:52:49 ET T: 05/24/2013 06:46:19 ET JOB#: [id number]  cc: Shaune Pollack, MD, <Dictator>  Shaune PollackQING Isabella Roemmich MD ELECTRONICALLY SIGNED 05/25/2013 14:45

## 2015-04-22 NOTE — Consult Note (Signed)
Impression: 39yo female w/ h/o DM admitted with atypical pneumonia with new diagnosis HIV infection.  Her sputum is growing a GNR, but her xray pattern is more consistent with an atypical infection.  Her preliminary HIV test was positive.  She reports negative testing about 10 years ago.  It is unclear how long she has been infected or what her CD4 count is.  She reports 120+# weight loss over the past 3 years.  No prior opportunistic infections.  Certainly PCP is a consideration if her CD4 count is low enough. Will continue ceftriaxone and TMP/SMX for now.   Will increase the ceftriaxone to 2g daily given her age. No need for levofloxacin.  TMP/SMX will cover atypical organisms very well. Will send CD4 count and HIV viral load and HIV genotype. Will await ID of GNR in sputum.  She may need bronch to look for PCP pneumonia. Her PO2 was not very low, but it may not have been obtained on RA.  Steroids for PCP are fairly standardized.  Will change her to oral prednisone per PCP protocol. There is no need for contact or respiratory isolation.  She denies any history of Methacillin Resistant Staph aureus. 8) She has E. coli in the urine.  This will by the current antibiotics.    Electronic Signatures: Sherrilynn Gudgel, Rosalyn GessMichael E (MD) (Signed on 21-Jan-14 17:19)  Authored   Last Updated: 21-Jan-14 17:35 by Steffie Waggoner, Rosalyn GessMichael E (MD)

## 2015-04-22 NOTE — H&P (Signed)
PATIENT NAME:  Kristen Villegas, Kristen Villegas MR#:  [id number] DATE OF BIRTH:  April 28, 1976  DATE OF ADMISSION:  05/19/2013  Primary care physician is supposed to be at [facility], infectious disease.   CHIEF COMPLAINT: Nausea, vomiting and fever today.  The patient is a 39 year old Caucasian female with history of AIDS not on HAART.  She is currently on prophylaxis for PCP pneumonia and MAI. History for anemia of chronic disease, history of oral thrush, [facility] with above-mentioned chief complaint. There is an Technical sales engineer in the room, brought in the patient from the jail when she started having nausea, vomiting and lower abdominal pain this morning. The patient's says she also had a fever of 102 at the jail.  Here in the Emergency Room, there is a fever of 99.2. Her blood pressure is 80/54. She was found to have elevated liver function tests on routine labs and ultrasound of the abdomen shows multiple gallstones. The patient is being admitted for further evaluation and management.   PAST MEDICAL HISTORY:   1. AIDS, not on HAART.  The patient is supposed to follow up at [facility] infectious disease.  2. Relative hypotension.  3. Oral thrush.  4. Anemia of chronic disease.  5. Acute respiratory failure requiring admission from 4/11 to 04/16/2013, suspected due to pneumonia, however, she was not diagnosed with any pneumocystis carinae pneumonia. She had undergone a chest tube placement which was removed prior to her discharge.  6. 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  7. History of adjustment reaction.  8. Diabetes, diet controlled.   CURRENT MEDICATIONS: 1. Septra DS 2 tablets b.i.d.  2. Cipro 500 mg 1 tablet b.i.d.  3. Buspirone 10 mg b.i.d.   ALLERGIES: PENICILLIN.   SOCIAL HISTORY: The patient currently comes from the jail. She has history of smoking half pack of tobacco per week.   FAMILY HISTORY: Diabetes and stroke in mother.   PAST SURGICAL HISTORY:  Pleurodesis x 2 with doxycycline in the past.   REVIEW OF SYSTEMS:   CONSTITUTIONAL: Positive for fever, fatigue, weakness.   EYES: No blurred or double vision. No glaucoma.   ENT: No tinnitus, ear pain, hearing loss.   RESPIRATORY: No cough, wheeze, hemoptysis.   CARDIOVASCULAR: No chest pain, orthopnea, edema or hypertension.   GASTROINTESTINAL: Positive for nausea and vomiting and lower abdominal pain. No GERD.   GENITOURINARY: No dysuria or hematuria.   ENDOCRINE: No polyuria, nocturia or thyroid problems.   HEMATOLOGY: Noted for chronic anemia.   SKIN: No acne or rash. The patient has a little area around the chest and her back on the right side where the chest tube insertion was done with mild erythema and serosanguineous discharge.   NEUROLOGICAL: No CVA or transient ischemic attack.   PSYCHIATRIC: No anxiety or depression. All other systems reviewed and negative.   PHYSICAL EXAMINATION: GENERAL: The patient is awake, alert, oriented x 3.  Temperature 99.2, pulse 74, blood pressure is 80/54, sats are 95% on room air.   HEENT: Atraumatic, normocephalic. Pupils are equal, round and reactive to light and accommodation. EOM intact. Oral mucosa, the patient has significant thrush on her tongue and buccal mucosa.   NECK: Supple. No JVD. No carotid bruit.   LUNGS: Clear to auscultation bilaterally. No rales, rhonchi, respiratory distress or labored breathing.   HEART: Both the heart sounds are normal. Rate, rhythm regular. PMI not lateralized. Chest is nontender.   EXTREMITIES: Good pedal pulses, good femoral pulses. No lower  extremity edema.   ABDOMEN: Soft, benign.  There is mild diffuse tenderness in the lower abdomen. No guarding, rigidity.  Murphy sign is negative. No mass felt. Bowel sounds are positive.   NEUROLOGIC: Grossly intact cranial nerves II through XII. No motor or sensory deficits.   SKIN: Warm and dry.   PSYCHIATRIC: The patient is awake, alert,  oriented x 3. Ultrasound of the abdomen showed multiple gallstones without evidence of acute cholecystitis. Liver, common bile duct visualized, and the pancreas is normal.   Chlamydia, Trichomonas and gonorrhea PCR are negative.  Wet prep is positive for trichomonas and clue cells.   CHEST X-RAY: No acute cardiopulmonary abnormality. White count 6.0, hemoglobin and hematocrit is 11.4 and 32.4, platelet count 131. Comprehensive metabolic panel within normal limits except sodium of 132.  SGPT 224, SGOT 183. Bilirubin is 0.2, total protein is 8.7. Albumin is 3.3. Lipase is 363. Urinalysis negative for urinary tract infection. LDH is 224.   ASSESSMENT AND PLAN: A 39 year old patient who has history of AIDS not on HAART, history diet-controlled diabetes, oral thrush, comes in with: 1. Elevated liver function tests with nausea, vomiting and lower abdominal pain along with fever. The patient is being admitted with possible suspected acute cholecystitis with cholelithiasis as noted on ultrasound of the abdomen along with elevated liver function tests. We will admit patient on surgical floor, keep her n.p.o.  2. Surgical consultation with Dr. Egbert GaribaldiBird.  3. Follow up liver function tests.  4. Monitor fever curve.  5. We will also monitor white count so far has been normal.  6. The patient already is on several different antibiotics for her infectious disease prophylaxis treatment; hence, I will not start any intravenous antibiotics at this time.  7. Oral thrush severe, we will give her Diflucan orally 100 mg.  8. Bacterial vaginosis along with trichomonas.  We will treat her with Flagyl for 7 days.  9. History of AIDS. The patient is not on highly active antiretroviral therapy.  Currently, she is on Bactrim for pneumocystis prophylaxis. She is on Cipro.  I am assuming this is for Mycobacterium avium-intracellulare prophylaxis. However, she was discharged on azithromycin. I will put her back on azithromycin at this  time.  10. Type 2 diabetes, diet controlled. The patient's sugars are stable. We will continue sliding scale insulin.  11. Further workup according to the patient's clinical course. Hospital admission plan was discussed with patient who is agreeable to it.   TIME SPENT: 50 minutes    ____________________________ Jearl KlinefelterSona A. Allena KatzPatel, MD sap:rw D: 05/19/2013 16:34:05 ET T: 05/19/2013 17:24:15 ET JOB#: [id number]  cc: Ehsan Corvin A. Allena KatzPatel, MD, <Dictator> Willow OraSONA A Jolly Bleicher MD ELECTRONICALLY SIGNED 05/21/2013 13:35

## 2015-04-22 NOTE — Consult Note (Signed)
Impression: 39yo female w/ h/o DM, AIDS, recent PCP pneumonia with PTX admitted with nausea, vomiting and diarrhea which have resolved.  Her initial symptoms were GI in nature and have resolved.  She has been living the the shelter and others in the shelter had a similar illness.  No further GI work up is needed. She denies having any pulmonary symptoms.  There is little to suggest a significant pneumonia.  Her CXR has an infiltrate, but she had had CT in place.  Would repeat the CXR.         Will d/c meropenemzosyn. Will change the azithro to po.  Will continue daily dosing until the CXR returns. She was noncompliant with being seen by me or in the [facility] due to cost.  Similarly, she has not been taking any OI prophylaxis due to inabilityto afford her meds.  She was in the process of applying for medicaid.  Care management is involved.  She is certainly disabled, and without medications, she will either be readmitted with an opportunistic infection or die in the near future. Would hold on HAART as she has not been able to get OI prophylaxis which is generic.  She would not be able to afford the more costly HAART medications.      Electronic Signatures: Kista Robb MPH, Rosalyn GessMichael E (MD) (Signed on 15-Apr-14 19:12)  Authored   Last Updated: 15-Apr-14 19:20 by Bert Givans MPH, Rosalyn GessMichael E (MD)

## 2015-04-22 NOTE — Consult Note (Signed)
PATIENT NAME:  Kristen Villegas, Kristen Villegas MR#:  [id number] DATE OF BIRTH:  1976/07/23  DATE OF CONSULTATION:  02/02/2013  REFERRING PHYSICIAN:   CONSULTING PHYSICIAN:  Malie Kashani E. Thelma Bargeaks, MD  REASON FOR CONSULTATION: Management of air leak.   HISTORY: I have personally seen and examined the patient. She is a  39 year old woman who was admitted with newly diagnosed HIV and bilateral pneumonias with a tension pneumothorax on the right. A chest tube was inserted with prompt re-expansion of the lung. The working diagnosis has been PCP. She has been appropriately treated with Bactrim and prednisone. Her air leak has persisted since her chest tube was inserted over a week ago and I was asked to manage that. Today on exam, her chest tube was interrogated. The tip of the Pleur-evac was just within the chest tube itself and we rewrapped this area and advanced the Pleur-evac into the tube. We then changed the chest tube apparatus out, as it had been fallen over with no water in water seal chamber. The new atrium was re-secured and there was indeed a small to moderate sized air leak. The chest x-ray from today was independently reviewed and does show a medially-based pneumothorax. I believe that at the present time we should keep the chest tube to suction.   Pleurodesis may be effective in some patients with persistent air leaks. However, because of her continued prednisone usage, it is unlikely that she could mount a sufficient immune response for effective pleurodesis. Therefore at the present time, I would recommend that we leave the chest tube on suction as it is right now. We will continually work on trying to get the suction down until she can be placed on water seal. At that point, she may be able to be managed as an outpatient with a tube placed to a Heimlich valve.      ____________________________ Sheppard Plumberimothy E. Thelma Bargeaks, MD teo:cc D: 02/02/2013 16:18:59 ET T: 02/02/2013 23:43:45 ET JOB#: [id number]  cc: Marcial Pacasimothy E.  Thelma Bargeaks, MD, <Dictator> Jasmine DecemberIMOTHY E Samai Corea MD ELECTRONICALLY SIGNED 02/10/2013 15:54

## 2015-04-22 NOTE — Discharge Summary (Signed)
PATIENT NAME:  Kristen Villegas, Kristen Villegas MR#:  [id number] DATE OF BIRTH:  12-17-1976  DATE OF ADMISSION:  08/02/2013 DATE OF DISCHARGE:  08/07/2013  DISCHARGE DIAGNOSES: 1.  Bilateral pneumonitis/pneumonia, now treated.  2.  Urinary tract infection, treated.  3.  Hypokalemia, repleted and resolved.  4.  History of AIDS not taking highly active antiretroviral therapy has been [facility], on Bactrim and Zithromax for prophylaxis.  5.  Cocaine/tobacco abuse and noncompliance with medication and Dr. Hector ShadeSuzette was counseled strongly encourage [facility].  5.  Vertigo and dizziness, likely due to dehydration with poor oral intake, now resolved.   SECONDARY DIAGNOSES: 1.  AIDS. Known history of noncompliance with medications and follow-up.  2.  Chronic recurrent open thrush.  3.  Chronic cystitis.   CONSULTATIONS: None.   PROCEDURES/RADIOLOGY: Chest x-ray on August 3 showed possible perihilar infiltrate.    [place] 3[place] and [place],  showed findings consistent with pneumonitis.   Chest x-ray on August 5 showed interstitial edema. Borderline cardiomegaly. No effusion. No pneumothorax.   Abdominal ultrasound on August 3 showed cholelithiasis. No evidence of cholecystitis. Hepatic steatosis.   MAJOR LABORATORY PANEL: Urinalysis on admission showed 126 WBCs, 1+ bacteria, 3+ leukocyte esterase, positive nitrite.   Blood cultures x 2 were negative on admission. Urine culture was contaminated.   HISTORY AND SHORT HOSPITAL COURSE: The patient is a 39 year old female with the above-mentioned medical problems who was admitted for pneumonitis. She was started on IV antibiotics. Please see Dr. Riley Nearingarwish's dictated history and physical for further details. The patient was improving slowly on IV antibiotics and was quite close to her baseline. She did remain hypotension throughout her hospital stay, although her baseline blood pressure, the  patient was running in the 90s at home also. She was not feeling any more dizziness, has been afebrile for 48 hours and is being discharged home in stable condition.   VITAL SIGNS: On the date of discharge, her vital signs are as follows: Temperature 99, heart rate 66 per minute, respirations 17 per minute, blood pressure 89/60, she was saturating 97% on room air.   PERTINENT PHYSICAL EXAMINATION ON THE DATE OF DISCHARGE:  CARDIOVASCULAR: S1, S2 normal. No murmurs or gallop.  LUNGS: Clear to auscultation bilaterally. No wheezes, rales, rhonchi or crepitation.  ABDOMEN: Soft, benign.  NEUROLOGIC: Nonfocal examination.  All other physical examination remained at baseline.   DISCHARGE MEDICATIONS: 1.  Metformin 500 mg p.o. b.i.d.  2.  Hydrochlorothiazide 12.5 mg p.o. daily.  3.  Bactrim double strength 1 capsule p.o. daily.  4.  Azithromycin 250 mg capsules p.o. every seven days.  5.  Diflucan 100 mg 2 tablets p.o. daily for seven days.  6.  Ultram 50 mg 1 to 2 tablets every six hours as needed.   DISCHARGE DIET: Low sodium.   DISCHARGE ACTIVITY: As tolerated.   DISCHARGE INSTRUCTIONS AND FOLLOW-UP: The patient was instructed to follow up [facility] in 1 to 2 weeks.   TOTAL TIME DISCHARGING THIS PATIENT: 55 minutes.    ____________________________ Ellamae SiaVipul S. Sherryll BurgerShah, MD vss:cc D: 08/09/2013 07:00:48 ET T: 08/09/2013 08:17:53 ET JOB#: [id number]  cc: Sirr Kabel S. Sherryll BurgerShah, MD, <Dictator> [facility] Stann Mainlandavid P. Sampson GoonFitzgerald, MD [facility] Ellamae SiaVIPUL [facility] MD ELECTRONICALLY SIGNED 08/09/2013 16:02

## 2015-04-22 NOTE — Consult Note (Signed)
PATIENT NAME:  Kristen Villegas, PUNG MR#:  [id number] DATE OF BIRTH:  06/10/1976  DATE OF CONSULTATION:  01/20/2013  CONSULTING PHYSICIAN:  Heinz Knuckles. Ayriel Texidor, MD  REFERRING PHYSICIAN:  Dr. _____    REASON FOR CONSULTATION:  Pneumonia in a newly diagnosed HIV patient.   HISTORY OF PRESENT ILLNESS: The patient is a 39 year old female with a past history significant for diabetes who was admitted on 01/18 with shortness of breath, hypoxia and fever. The patient states that she had initially begun getting sick approximately 5 weeks ago when she had several episodes of thrush. She was seen in the Emergency Room several times and was given prescription for fluconazole but she could not afford them. She eventually took one week's worth of fluconazole and her thrush cleared. This was approximately 5 days prior to admission. In that timeframe, however, she began having generalized malaise. Her boyfriend who lives with her also had a respiratory illness. He recovered; however, she continued to be ill. She had myalgias, fevers, chills, sweats, nausea, vomiting and became extremely short of breath and confused prompting her presentation in the Emergency Room. In the Emergency Room, she was found to be hypoxic and was admitted to the hospital. She has had sputum culture that is growing a gram-negative rod and urine culture that is growing E coli. She also had an HIV test, which was preliminarily positive. The patient was initially given Levaquin and trimethoprim and sulfamethoxazole was added after the HIV diagnosis was made and ceftriaxone was added again today. She is currently on Levaquin, ceftriaxone and Bactrim. She states that she was HIV tested approximately 10 years ago as part of a physical. Her HIV risk factors include multiple sexual partners. She denies any injecting drug use. She has not had any blood transfusions. She is feeling somewhat better and is mentating much better than on admission. She still is  requiring high flow nasal cannula, however.   ALLERGIES:  Include PENICILLIN.   PAST MEDICAL HISTORY:  Diabetes that is diet-controlled.   FAMILY HISTORY:  Positive for diabetes and stroke.   SOCIAL HISTORY: The patient lives with her boyfriend in a home with another couple. She has no pets at home. She does not drink. She does not have any injecting drug use history. She smokes 1/2 pack cigarettes per week.  REVIEW OF SYSTEMS:  GENERAL:  Positive fevers, chills, sweats, malaise and myalgias.  HEENT:  No headaches. No sinus congestion. No sore throat.  NECK:  No stiffness. No swollen glands, although she did have some swollen glands when she had the thrush previously. CHEST:  Positive cough, shortness of breath and sputum production was initially greenish color now changed to white.  CARDIAC:  No chest pains other than with coughing. No palpitations. No peripheral edema.  GASTROINTESTINAL:  Positive anorexia. Positive nausea and vomiting. Positive diarrhea. Positive 120-plus pounds weight loss over the last 3 years.  GENITOURINARY:  She has had some minimal dysuria, but no change in frequency. No hematuria.  MUSCULOSKELETAL:  She has generalized achiness with myalgias but no arthralgias. No frank arthritis.  SKIN:  No rashes.  NEUROLOGIC:  No focal weakness.  PSYCHIATRIC:  No complaints. She was confused on admission but is improved now.   PHYSICAL EXAMINATION: VITAL SIGNS:  T-max of 98.9, T-current of 97.4, pulse of 93, blood pressure 112/70, 94% on room air, but I suspect this is on high flow nasal cannula as that is what she is currently on.  GENERAL:  A 39 year old white  female in no acute distress, able to speak in full sentences.  HEENT:  Normocephalic, atraumatic. Pupils equal and round, reactive to light. Extraocular motion intact. Sclerae, conjunctivae and lids without evidence for emboli or petechiae. Oropharynx shows no erythema or exudate. Teeth and gums are in fair condition.  No evidence for thrush.  NECK:  Midline trachea. No lymphadenopathy. No thyromegaly.  CHEST:  Clear to auscultation bilaterally with good air movement. No focal consolidation.  CARDIAC:  Regular rate and rhythm without murmur, rub or gallop.  ABDOMEN:  Soft, nontender and nondistended. No hepatosplenomegaly. No hernia is noted.  EXTREMITIES:  No evidence for tenosynovitis.  SKIN:  No rashes. No stigmata of endocarditis, specifically no Janeway lesions. No Osler nodes.  NEUROLOGIC:  The patient is awake and interactive, moving all 4 extremities. She is mentating well.  PSYCHIATRIC:  Mood and affect appeared normal.   LABORATORY DATA:  BUN 9, creatinine 0.60, bicarbonate 23, anion gap of 8. LFTs show an AST of 64, ALT of 13, alk phos of 59. Total bilirubin is 0.5. In the past the AST and ALT have been in the low 100s. Tox screen was negative. White count today was 8.4 with a hemoglobin 9.2, platelet count of 285, ANC of 7.6. On admission white count was 6.7 with an ANC of 4.7. Blood cultures from admission show no growth. Urinalysis had 2+ blood, negative nitrites, 3+ leukocyte esterase, 47 red cells, 768 white cells. Urine culture is growing E coli. Rapid influenza testing was negative. Sputum culture is growing a moderate growth of gram-negative rods. An HIV test is preliminarily positive. Her ABG on January 19 was 7.48/32/97/95.9. It is unclear whether she was on oxygen at that time.  Chest x-ray from admission shows nonspecific diffuse interstitial opacities. A CT scan of the abdomen and pelvis demonstrated extensive pulmonary opacities but no findings in the abdomen or pelvis. Chest x-ray from 01/19 showed interval increase in diffuse bilateral pulmonary opacities.   IMPRESSION:  A 39 year old female with a history of diabetes admitted with atypical pneumonia and a new diagnosis of HIV infection.   RECOMMENDATIONS: 1.  Her sputum is growing a gram-negative rod but her x-ray pattern is more  consistent with atypical infection. Her preliminary HIV test was positive. She reports negative testing about 10 years ago. It is unclear how long she has been infected or what her CD4 count is. She reports 120+ pounds weight loss over the past 3 years. She had no prior opportunistic infections. Certainly PCP is a consideration if her CD4 count is low enough. 2.  Will continue ceftriaxone and trimethoprim and sulfamethoxazole for now. We will increase the ceftriaxone to 2 grams daily given her age. There is no need for levofloxacin. Trimethoprim and sulfamethoxazole will cover atypical organisms very well.  3.  I will send a CD4 count, HIV viral load and HIV genotype. 4.  We will await the ID of the gram-negative rod in the sputum. She may need a bronch to rule out PCP pneumonia.  5.  Her PO2 was not very low on the ABG but it may not have been obtained on room air. Steroids for PCP are fairly standardized. We will change her oral prednisone regimen per PCP protocol.  6.  There is no need for contact or respiratory isolation. She denies any history of MRSA.  7.  She has E coli in the urine. This should be covered by the current antibiotics.  This is a high level infectious disease consult.  Thank you much for involving me in Ms. Kealey's care.      ____________________________ Heinz Knuckles. Shaila Gilchrest, MD meb:ce D: 01/20/2013 17:35:00 ET T: 01/20/2013 18:01:49 ET JOB#: [id number]  cc: Heinz Knuckles. Maryn Freelove, MD, <Dictator> Jaylen Claude E Georga Stys MD ELECTRONICALLY SIGNED 01/26/2013 15:26

## 2015-04-22 NOTE — H&P (Signed)
PATIENT NAME:  Kristen Villegas, Kristen Villegas#:  [id number] DATE OF BIRTH:  [birth year]  DATE OF ADMISSION:  08/03/2013  PR[facility] [place].   REFERRING PHYSICIAN: Dr. Glenetta HewMcLaurin.   CHIEF COMPLAINT: Lightheadedness, vertigo, feeling hot.   HISTORY OF PRESENT ILLNESS: Ms. Kristen Villegas is a 39 year old Caucasian female with history of acquired immunodeficiency disease, noncompliant, does not take her medications for economic reasons. [facility], and she appears here at this hospital from time to time when she has complications. She has been feeling well until yesterday when she felt that she is hot and vomited once without hematemesis. Those symptoms improved. Today, she had recurrence of her symptoms, including the dizziness and vertigo. Evaluation here at the Emergency Department reveals the presence of oral thrush and cystitis findings. She was also found to have significant hypokalemia. There were plans to treat the patient and discharge home; however, the chest x-ray came back showing evidence of bilateral pneumonitis. Upon further questioning the patient, she states that now just at the Emergency Department while she was waking she started to have cough but did not have this complaint before. It appears to be just mild cough. There is no chest pain. No shortness of breath. No chills.   REVIEW OF SYSTEMS:   CONSTITUTIONAL: Unsure if she had fever, but she had feeling of being hot. No chills. No fatigue.  EYES: No blurring of vision. No double vision.  ENT: Reported dizziness and vertigo-like feeling. This is intermittent since last night. No hearing deficit. No sore throat.  CARDIOVASCULAR: No chest pain. No shortness of breath. No edema.  RESPIRATORY: She just started to have mild dry cough. No shortness of breath. No chest pain. She has chronic right lower chest tenderness. This is chronic at the site of previous chest tube insertion, and it appears musculoskeletal in origin.   GASTROINTESTINAL: Denies any abdominal pain, but she had vomiting yesterday; none today. No diarrhea. No melena or hematochezia.  MUSCULOSKELETAL: No joint pain or swelling. No muscular pain or swelling.  INTEGUMENTARY: No skin rash. No ulcers.  NEUROLOGY: No focal weakness. No seizure activity. No headache.  PSYCHIATRY: No anxiety or depression.  ENDOCRINE: No polyuria or polydipsia. No heat or cold intolerance.   PAST MEDICAL HISTORY: Acquired immunodeficiency disease. Long history of noncompliance with medications and with followup. Chronic recurrent oral thrush, chronic cystitis. Earlier this year, her CD4 count was 16. Cholelithiasis.  PAST SURGICAL HISTORY: Chest tube placement and lung biopsy.   SOCIAL HABITS: Chronic smoker. She quit for a while, then she is back smoking again for the last 1 month. She states that she smokes only 3 cigarettes a day. Denies alcohol intake and denies using cocaine, even though her drug screen came back positive for cocaine.   SOCIAL HISTORY: She is divorced for quite some time. Right now, she is living with her boyfriend. She is unemployed.   FAMILY HISTORY: Her father died from complications of a heart attack. Her mother died from complications of multiple strokes. There is also family history of diabetes.   ADMISSION MEDICATIONS: None. The patient stated that she cannot afford the medication list that [facility] [place] which may cost her $60.   ALLERGIES: PENICILLIN. The patient is not allergic to Rocephin.   PHYSICAL EXAMINATION:  VITAL SIGNS: Blood pressure 100/58, respiratory rate 18, pulse 68, temperature 98.8, oxygen saturation 97%.  GENERAL APPEARANCE: Young female lying in bed in no acute distress, healthy looking.  HEAD AND NECK: No pallor. No icterus. No cyanosis. Ear examination revealed normal hearing, no discharge, no lesions. Examination of the nose showed no discharge, no bleeding, no ulcers. Oropharyngeal examination  revealed normal lips. The tongue is coated with oral thrush. No ulcers. Eye examination revealed normal eyelids and conjunctivae. Pupils about 4 mm to 5 mm, round, equal and reactive to light. Neck is supple. Trachea at midline. No thyromegaly. No cervical lymphadenopathy. No masses.  HEART: Normal S1, S2. No S3, S4. No murmur. No gallop. No carotid bruits.  RESPIRATORY: Normal breathing pattern without use of accessory muscles. No rales. No wheezing.  ABDOMEN: Soft without tenderness. No hepatosplenomegaly. No masses. No hernias.  SKIN: No ulcers. No subcutaneous nodules.  MUSCULOSKELETAL: No joint swelling. No clubbing.  NEUROLOGIC: Cranial nerves II through XII were intact. No focal motor deficit.  PSYCHIATRIC: The patient is alert, oriented x3. Mood and affect were normal.   LABORATORY FINDINGS: Her chest x-ray showed increased pulmonary markings suspicious for findings consistent with bilateral pneumonitis. Ultrasound of the abdomen showed cholelithiasis which is an old finding. Hepatic steatosis. Urine pregnancy test is negative. Serum glucose 107, BUN 7, creatinine 0.8, sodium 136, potassium 2.8. Lipase 196, albumin 3.3, alkaline phosphatase 44. AST and ALT were normal. Bilirubin 0.5. Urine drug screen was positive for cocaine. CBC showed white count of 5000, hemoglobin 12, hematocrit 34, platelet count 148. Urinalysis showed 126 white blood cells, +3 leukocyte esterase, +1 bacteria.   ASSESSMENT:  1. Bilateral pneumonitis.  2. Lower urinary tract infection.  3. Acquired immunodeficiency disease.  4. Oral thrust.  5. Hypokalemia.  6. Vertigo and dizziness.  7. Cocaine abuse.  8. Tobacco abuse.  9. Noncompliance with medications and visits.  10. Cholelithiasis with no complicating features.  PLAN: Blood cultures x2 were taken. Urine culture. The patient started on Rocephin and Zithromax. Bactrim double strength orally started as well. Diflucan to treat her oral thrush. Infectious disease  consultation. IV hydration and potassium replacement to correct her hypokalemia. The patient is at low risk for deep vein thrombosis. Therefore, prophylaxis was not initiated.   Time spent in evaluating this patient and reviewing her medical records took more than 1 hour.   ____________________________ Carney Corners. Rudene Re, MD amd:gb D: 08/03/2013 00:27:12 ET T: 08/03/2013 00:51:24 ET JOB#: [id number]  cc: Carney Corners. Rudene Re, MD, <Dictator> Karolee Ohs Dala Dock MD ELECTRONICALLY SIGNED 08/04/2013 7:01

## 2015-06-15 DIAGNOSIS — N879 Dysplasia of cervix uteri, unspecified: Secondary | ICD-10-CM | POA: Insufficient documentation

## 2015-07-09 ENCOUNTER — Emergency Department
Admission: EM | Admit: 2015-07-09 | Discharge: 2015-07-09 | Disposition: A | Discharge status: Home / Self Care | Payer: Medicare Other | Attending: Emergency Medicine | Admitting: Emergency Medicine

## 2015-07-09 ENCOUNTER — Encounter: Payer: Self-pay | Admitting: Emergency Medicine

## 2015-07-09 DIAGNOSIS — K047 Periapical abscess without sinus: Secondary | ICD-10-CM | POA: Insufficient documentation

## 2015-07-09 DIAGNOSIS — E119 Type 2 diabetes mellitus without complications: Secondary | ICD-10-CM | POA: Diagnosis not present

## 2015-07-09 DIAGNOSIS — Z880 Allergy status to penicillin: Secondary | ICD-10-CM | POA: Diagnosis not present

## 2015-07-09 DIAGNOSIS — K088 Other specified disorders of teeth and supporting structures: Secondary | ICD-10-CM | POA: Diagnosis present

## 2015-07-09 HISTORY — DX: Human immunodeficiency virus (HIV) disease: B20

## 2015-07-09 HISTORY — DX: Other pulmonary collapse: J98.19

## 2015-07-09 HISTORY — DX: Type 2 diabetes mellitus without complications: E11.9

## 2015-07-09 HISTORY — DX: Unspecified convulsions: R56.9

## 2015-07-09 LAB — BASIC METABOLIC PANEL
Anion gap: 7 (ref 5–15)
BUN: 11 mg/dL (ref 6–20)
CHLORIDE: 108 mmol/L (ref 101–111)
CO2: 23 mmol/L (ref 22–32)
Calcium: 8.5 mg/dL — ABNORMAL LOW (ref 8.9–10.3)
Creatinine, Ser: 0.71 mg/dL (ref 0.44–1.00)
GFR calc Af Amer: >60 mL/min (ref >60)
GFR calc non Af Amer: >60 mL/min (ref >60)
GLUCOSE: 125 mg/dL — AB (ref 65–99)
POTASSIUM: 3.3 mmol/L — AB (ref 3.5–5.1)
Sodium: 138 mmol/L (ref 135–145)

## 2015-07-09 LAB — CBC WITH DIFFERENTIAL/PLATELET
Basophils Absolute: 0 10*3/uL (ref 0–0.1)
Basophils Relative: 0 %
EOS ABS: 0.2 10*3/uL (ref 0–0.7)
Eosinophils Relative: 2 %
HEMATOCRIT: 36.6 % (ref 35.0–47.0)
Hemoglobin: 12.3 g/dL (ref 12.0–16.0)
LYMPHS PCT: 29 %
Lymphs Abs: 3.2 10*3/uL (ref 1.0–3.6)
MCH: 29.3 pg (ref 26.0–34.0)
MCHC: 33.6 g/dL (ref 32.0–36.0)
MCV: 86.9 fL (ref 80.0–100.0)
MONO ABS: 0.9 10*3/uL (ref 0.2–0.9)
Monocytes Relative: 8 %
Neutro Abs: 6.9 10*3/uL — ABNORMAL HIGH (ref 1.4–6.5)
Neutrophils Relative %: 61 %
Platelets: 215 10*3/uL (ref 150–440)
RBC: 4.21 MIL/uL (ref 3.80–5.20)
RDW: 14.2 % (ref 11.5–14.5)
WBC: 11.3 10*3/uL — AB (ref 3.6–11.0)

## 2015-07-09 MED ORDER — CLINDAMYCIN PHOSPHATE 600 MG/50ML IV SOLN
600.0000 mg | Freq: Once | INTRAVENOUS | Status: AC
Start: 2015-07-09 — End: 2015-07-09
  Administered 2015-07-09: 600 mg via INTRAVENOUS

## 2015-07-09 MED ORDER — CLINDAMYCIN HCL 150 MG PO CAPS: 150.0000 mg | ORAL_CAPSULE | Freq: Four times a day (QID) | ORAL | Status: DC

## 2015-07-09 MED ORDER — HYDROMORPHONE HCL 1 MG/ML IJ SOLN
INTRAMUSCULAR | Status: AC
  Administered 2015-07-09: 1 mg via INTRAVENOUS
  Filled 2015-07-09: qty 1

## 2015-07-09 MED ORDER — IBUPROFEN 800 MG PO TABS: 800.0000 mg | ORAL_TABLET | Freq: Three times a day (TID) | ORAL | Status: AC | PRN

## 2015-07-09 MED ORDER — OXYCODONE-ACETAMINOPHEN 7.5-325 MG PO TABS: 1.0000 | ORAL_TABLET | Freq: Four times a day (QID) | ORAL | Status: DC | PRN

## 2015-07-09 MED ORDER — CLINDAMYCIN PHOSPHATE 600 MG/50ML IV SOLN
INTRAVENOUS | Status: AC
  Administered 2015-07-09: 600 mg via INTRAVENOUS
  Filled 2015-07-09: qty 50

## 2015-07-09 MED ORDER — HYDROMORPHONE HCL 1 MG/ML IJ SOLN
1.0000 mg | Freq: Once | INTRAMUSCULAR | Status: AC
  Administered 2015-07-09: 1 mg via INTRAVENOUS

## 2015-07-09 NOTE — Discharge Instructions (Signed)
OPTIONS FOR DENTAL FOLLOW UP CARE ° °[place] [facility] and Human Services - Local Safety Net Dental Clinics °http://www.ncdhhs.gov/dph/oralhealth/services/safetynetclinics.htm °  °[facility] (336-562-3123) ° °[facility] [place] (919-933-9087) ° °[facility] ([phone] ext [phone]) ° °[facility] ([phone]) ° °[facility] ([phone]) °This clinic caters to the indigent population and is on a lottery system. °Location: °[facility], [address], [place] °Clinic Hours: °Wednesdays from 6pm - 9pm, patients seen by a lottery system. °For dates, call or go to www.med.[facility].edu/[facility]/patients/Dental-[facility] °Services: °Cleanings, fillings and simple extractions. °Payment Options: °DENTAL WORK IS FREE OF CHARGE. Bring proof of income or support. °Best way to get seen: °Arrive at 5:15 pm - this is a lottery, NOT first come/first serve, so arriving earlier will not increase your chances of being seen. °  °  °[facility] °919-537-3737 °Select option 1 for emergencies °  °Location: °[facility], [address], [place] °Clinic Hours: °No walk-ins accepted - call the day before to schedule an appointment. °Check in times are 9:30 am and 1:30 pm. °Services: °Simple extractions, temporary fillings, pulpectomy/pulp debridement, uncomplicated abscess drainage. °Payment Options: °PAYMENT IS DUE AT THE TIME OF SERVICE.  Fee is usually $100-200, additional surgical procedures (e.g. abscess drainage) may be extra. °Cash, checks, Visa/MasterCard accepted.  Can file Medicaid if patient is covered for dental - patient should call case worker to check. °No discount for [facility] patients. °Best way to get seen: °MUST call the day before and get onto the schedule. Can usually be seen the next 1-2 days. No walk-ins accepted. °  °  °[facility] °[phone] °   °Location: °[facility], [address], [place] °Clinic Hours: °M, W, Th, F 8am or 1:30pm, Tues 9a or 1:30 - first come/first served. °Services: °Simple extractions, temporary fillings, uncomplicated abscess drainage.  You do not need to be an [place] resident. °Payment Options: °PAYMENT IS DUE AT THE TIME OF SERVICE. °Dental insurance, otherwise sliding scale - bring proof of income or support. °Depending on income and treatment needed, cost is usually $50-200. °Best way to get seen: °Arrive early as it is first come/first served. °  °  °[facility] °[phone] °  °Location: °[address] °Clinic Hours: °Mon-Thu 8a-5p °Services: °Most basic dental services including extractions and fillings. °Payment Options: °PAYMENT IS DUE AT THE TIME OF SERVICE. °Sliding scale, up to 50% off - bring proof if income or support. °Medicaid with dental option accepted. °Best way to get seen: °Call to schedule an appointment, can usually be seen within 2 weeks OR they will try to see walk-ins - show up at 8a or 2p (you may have to wait). °  °  °[facility] °919-245-2435 °[place] RESIDENTS ONLY °  °Location: °[facility], [address], [place],  [address] °Clinic Hours: By appointment only. °Monday - Thursday 8am-5pm, Friday 8am-12pm °Services: Cleanings, fillings, extractions. °Payment Options: °PAYMENT IS DUE AT THE TIME OF SERVICE. °Cash, Visa or MasterCard. Sliding scale - $30 minimum per service. °Best way to get seen: °Come in to office, complete packet and make an appointment - need proof of income °or support monies for each household member and proof of [place] residence. °Usually takes about a month to get in. °  °  °[facility] °[phone] °  °Location: °[address]   [place] °Clinic Hours: Walk-in Urgent Care Dental Services are offered Monday-Friday  mornings only. °The numbers of emergencies accepted daily is limited to the number of °providers available. °Maximum 15 - Mondays, Wednesdays & Thursdays °Maximum 10 - Tuesdays & Fridays °Services: °You do not need to be a [place] resident to be seen for a dental emergency. °Emergencies are defined as pain, swelling, abnormal bleeding, or dental trauma. Walkins will receive x-rays if needed. °NOTE: Dental cleaning is not an emergency. °Payment Options: °PAYMENT IS DUE AT THE TIME OF SERVICE. °Minimum co-pay is $40.00 for uninsured patients. °Minimum co-pay is $3.00 for Medicaid with dental coverage. °Dental Insurance is accepted and must be presented at time of visit. °Medicare does not cover dental. °Forms of payment: Cash, credit card, checks. °Best way to get seen: °If not previously registered with the clinic, walk-in dental registration begins at 7:15 am and is on a first come/first serve basis. °If previously registered with the clinic, call to make an appointment. °  °  °[facility] °919-776-4359 °[place] RESIDENTS ONLY °  °Location: °[address], [place], [place] °Clinic Hours: °Mon-Thu 10a-2p °Services: Extractions only! °Payment Options: °FREE (donations accepted) - bring proof of income or support °Best way to get seen: °Call and schedule an appointment OR come at 8am on the 1st Monday of every month (except for holidays) when it is first come/first served. °  °  °[facility] °[phone] °  °Location: °[address], [place] °Clinic Hours: °Friday mornings °Services, Payment Options, Best way to get seen: °Call for info ° °Dental Abscess °A dental abscess is a collection of infected fluid (pus) from a bacterial infection in the inner part of the tooth (pulp). It usually occurs at the end of the tooth's root.  °CAUSES  °· Severe tooth decay. °· Trauma to the tooth that allows bacteria to enter into the pulp, such as a broken or chipped tooth. °SYMPTOMS  °· Severe pain in and  around the infected tooth. °· Swelling and redness around the abscessed tooth or in the mouth or face. °· Tenderness. °· Pus drainage. °· Bad breath. °· Bitter taste in the mouth. °· Difficulty swallowing. °· Difficulty opening the mouth. °· Nausea. °· Vomiting. °· Chills. °· Swollen neck glands. °DIAGNOSIS  °· A medical and dental history will be taken. °· An examination will be performed by tapping on the abscessed tooth. °· X-rays may be taken of the tooth to identify the abscess. °TREATMENT °The goal of treatment is to eliminate the infection. You may be prescribed antibiotic medicine to stop the infection from spreading. A root canal may be performed to save the tooth. If the tooth cannot be saved, it may be pulled (extracted) and the abscess may be drained.  °HOME CARE INSTRUCTIONS °· Only take over-the-counter or prescription medicines for pain, fever, or discomfort as directed by your caregiver. °· Rinse your mouth (gargle) often with salt water (¼ tsp salt in 8 oz [250 ml] of warm water) to relieve pain or swelling. °· Do not drive after taking pain medicine (narcotics). °· Do not apply heat to the outside of your face. °· Return to your dentist for further treatment as directed. °SEEK MEDICAL CARE IF: °· Your pain is not helped by medicine. °· Your pain is getting worse instead of better. °SEEK IMMEDIATE MEDICAL CARE IF: °· You have a fever or persistent symptoms for more than 2-3 days. °· You have a fever and your symptoms suddenly   get worse.  You have chills or a very bad headache.  You have problems breathing or swallowing.  You have trouble opening your mouth.  You have swelling in the neck or around the eye. Document Released: 12/17/2005 Document Revised: 09/10/2012 Document Reviewed: 03/27/2011 [facility] Patient Information 2015 [employer] [place]. This information is not intended to replace advice given to you by your health care provider. Make sure you discuss any questions you have with your  health care provider.

## 2015-07-09 NOTE — ED Notes (Signed)
[facility]. C/o dental pain since yesterday. Points to left upper molar.

## 2015-07-09 NOTE — ED Provider Notes (Signed)
[facility] Emergency Department Provider Note  ____________________________________________  Time seen: Approximately 9:18 AM  I have reviewed the triage vital signs and the nursing notes.   HISTORY  Chief Complaint Dental Pain    HPI Kristen Villegas is a 39 y.o. female complaining of acute onset of the left maxillary swelling and dental pain to the left upper molar. Patient's cervical home was able to contact a dentist over the weekend is sensitive to the ER. Patient is rating her pain as a 10 over 10. No palliative measures given at the nursing home.   Past Medical History  Diagnosis Date  . HIV disease   . Seizures   . Diabetes mellitus without complication   . Collapsed lung     There are no active problems to display for this patient.   History reviewed. No pertinent past surgical history.  Current Outpatient Rx  Name  Route  Sig  Dispense  Refill  . clindamycin (CLEOCIN) 150 MG capsule   Oral   Take 1 capsule (150 mg total) by mouth 4 (four) times daily.   40 capsule   0   . ibuprofen (ADVIL,MOTRIN) 800 MG tablet   Oral   Take 1 tablet (800 mg total) by mouth every 8 (eight) hours as needed for moderate pain.   15 tablet   0   . oxyCODONE-acetaminophen (PERCOCET) 7.5-325 MG per tablet   Oral   Take 1 tablet by mouth every 6 (six) hours as needed for severe pain.   12 tablet   0     Allergies Penicillins  No family history on file.  Social History History  Substance Use Topics  . Smoking status: Current Every Day Smoker  . Smokeless tobacco: Not on file  . Alcohol Use: No    Review of Systems Constitutional: No fever/chills Eyes: No visual changes. ENT: No sore throat. Cardiovascular: Denies chest pain. Respiratory: Denies shortness of breath. Gastrointestinal: No abdominal pain.  No nausea, no vomiting.  No diarrhea.  No constipation. Genitourinary: Negative for dysuria. Musculoskeletal: Negative for back  pain. Skin: Negative for rash. Neurological: Negative for headaches, focal weakness or numbness. 0-point ROS otherwise negative.  ____________________________________________   PHYSICAL EXAM:  VITAL SIGNS: ED Triage Vitals  Enc Vitals Group     BP --      Pulse --      Resp --      Temp --      Temp src --      SpO2 07/09/15 0915 98 %     Weight --      Height --      Head Cir --      Peak Flow --      Pain Score 07/09/15 0918 10     Pain Loc --      Pain Edu? --      Excl. in GC? --     Constitutional: Alert and oriented. Well appearing and in no acute distress. Eyes: Conjunctivae are normal. PERRL. EOMI. Head: Atraumatic. Nose: No congestion/rhinnorhea.  Mouth/Throat: Mucous membranes are moist.  Oropharynx non-erythematous. Devitalized molar #14. Gingival edema and erythema. Left maxillary edema and erythema. Neck: No stridor. No cervical spine tenderness to palpation. Hematological/Lymphatic/Immunilogical: No cervical lymphadenopathy. Cardiovascular: Normal rate, regular rhythm. Grossly normal heart sounds.  Good peripheral circulation. Respiratory: Normal respiratory effort.  No retractions. Lungs CTAB. Gastrointestinal: Soft and nontender. No distention. No abdominal bruits. No CVA tenderness. Musculoskeletal: No lower extremity tenderness nor edema.  No joint effusions. Neurologic:  Normal speech and language. No gross focal neurologic deficits are appreciated. Speech is normal. No gait instability. Skin:  Skin is warm, dry and intact. No rash noted. Psychiatric: Mood and affect are normal. Speech and behavior are normal.  ____________________________________________   LABS (all labs ordered are listed, but only abnormal results are displayed)  Labs Reviewed  CBC WITH DIFFERENTIAL/PLATELET - Abnormal; Notable for the following:    WBC 11.3 (*)    Neutro Abs 6.9 (*)    All other components within normal limits  BASIC METABOLIC PANEL - Abnormal; Notable for  the following:    Potassium 3.3 (*)    Glucose, Bld 125 (*)    Calcium 8.5 (*)    All other components within normal limits   ____________________________________________  EKG   ____________________________________________  RADIOLOGY   ____________________________________________   PROCEDURES  Procedure(s) performed: None  Critical Care performed: No  ____________________________________________   INITIAL IMPRESSION / ASSESSMENT AND PLAN / ED COURSE  Pertinent labs & imaging results that were available during my care of the patient were reviewed by me and considered in my medical decision making (see chart for details).  Dental abscess. Patient given IV clindamycin and Dilaudid. Patient will be discharged prescription for clindamycin Percocets and ibuprofen. Patient return back to the group home and advised follow-up with dental clinic either by her chills from the list provided. Patient was to ER if condition worsens.  OPTIONS FOR DENTAL FOLLOW UP CARE  [place] Department of Health and Human Services - Local Safety Net Dental Clinics [email or web address]://www.ncdhhs.gov/dph/oralhealth/services/safetynetclinics.htm   [facility] [phone])  [facility] [phone])  [facility] [phone])  [facility] [phone])  [facility] [phone]) This clinic caters to the indigent population and is on a lottery system. Location: [employer] [address] [address], [address], [place] Clinic Hours: Wednesdays from 6pm - 9pm, patients seen by a lottery system. For dates, call or go to [email or web address] Services: Cleanings, fillings and simple extractions. Payment Options: DENTAL WORK IS FREE OF CHARGE. Bring proof of income or support. Best way to get seen: Arrive at 5:15 pm - this is a lottery, NOT first come/first serve, so arriving earlier will not increase your  chances of being seen.     [facility] [phone] Select option 1 for emergencies   Location: [facility] [address], [address], [place] Clinic Hours: No walk-ins accepted - call the day before to schedule an appointment. Check in times are 9:30 am and 1:30 pm. Services: Simple extractions, temporary fillings, pulpectomy/pulp debridement, uncomplicated abscess drainage. Payment Options: PAYMENT IS DUE AT THE TIME OF SERVICE.  Fee is usually $100-200, additional surgical procedures (e.g. abscess drainage) may be extra. Cash, checks, Visa/MasterCard accepted.  Can file Medicaid if patient is covered for dental - patient should call case worker to check. No discount for [facility] patients. Best way to get seen: MUST call the day before and get onto the schedule. Can usually be seen the next 1-2 days. No walk-ins accepted.     [facility] [phone]   Location: [facility], [address], [place] Clinic Hours: M, W, Th, F 8am or 1:30pm, Tues 9a or 1:30 - first come/first served. Services: Simple extractions, temporary fillings, uncomplicated abscess drainage.  You do not need to be an [place] resident. Payment Options: PAYMENT IS DUE AT THE TIME  OF SERVICE. Dental insurance, otherwise sliding scale - bring proof of income or support. Depending on income and treatment needed, cost is usually $50-200. Best way to get seen: Arrive early as it is first come/first served.     [facility] [phone]   Location: [address] Clinic Hours: Mon-Thu 8a-5p Services: Most basic dental services including extractions and fillings. Payment Options: PAYMENT IS DUE AT THE TIME OF SERVICE. Sliding scale, up to 50% off - bring proof if income or support. Medicaid with dental option accepted. Best way to get seen: Call to schedule an  appointment, can usually be seen within 2 weeks OR they will try to see walk-ins - show up at 8a or 2p (you may have to wait).     [facility] [phone] [place] RESIDENTS ONLY   Location: [facility], [address] [place], [place] [address] Clinic Hours: By appointment only. Monday - Thursday 8am-5pm, Friday 8am-12pm Services: Cleanings, fillings, extractions. Payment Options: PAYMENT IS DUE AT THE TIME OF SERVICE. Cash, Visa or MasterCard. Sliding scale - $30 minimum per service. Best way to get seen: Come in to office, complete packet and make an appointment - need proof of income or support monies for each household member and proof of [facility] residence. Usually takes about a month to get in.     [facility] [phone]   Location: [address] [facility] Clinic Hours: Walk-in Urgent Care Dental Services are offered Monday-Friday mornings only. The numbers of emergencies accepted daily is limited to the number of providers available. Maximum 15 - Mondays, Wednesdays & Thursdays Maximum 10 - Tuesdays & Fridays Services: You do not need to be a [facility] resident to be seen for a dental emergency. Emergencies are defined as pain, swelling, abnormal bleeding, or dental trauma. Walkins will receive x-rays if needed. NOTE: Dental cleaning is not an emergency. Payment Options: PAYMENT IS DUE AT THE TIME OF SERVICE. Minimum co-pay is $40.00 for uninsured patients. Minimum co-pay is $3.00 for Medicaid with dental coverage. Dental Insurance is accepted and must be presented at time of visit. Medicare does not cover dental. Forms of payment: Cash, credit card, checks. Best way to get seen: If not previously registered with the clinic, walk-in dental registration begins at 7:15 am and is on a first come/first serve basis. If previously registered with the clinic, call to make an appointment.      [facility] [phone] [place] RESIDENTS ONLY   Location: [address], [place], [place] Clinic Hours: Mon-Thu 10a-2p Services: Extractions only! Payment Options: FREE (donations accepted) - bring proof of income or support Best way to get seen: Call and schedule an appointment OR come at 8am on the 1st Monday of every month (except for holidays) when it is first come/first served.     [facility] [phone]   Location: [address] [place], [place] Clinic Hours: Friday mornings Services, Payment Options, Best way to get seen: Call for info  ____________________________________________   FINAL CLINICAL IMPRESSION(S) / ED DIAGNOSES  Final diagnoses:  Abscess, dental      Joni Reining, PA-C 07/09/15 1050  Minna Antis, MD 07/09/15 1527

## 2015-07-09 NOTE — ED Notes (Signed)
Kristen MortimerWanda contacted at group home. Instructs patient to [facility]. Phone number given to patient and patient taken to lobby courtesy phone to make arrangements. Pt knows address of group home.

## 2015-08-25 ENCOUNTER — Emergency Department: Payer: Medicare Other

## 2015-08-25 ENCOUNTER — Encounter: Payer: Self-pay | Admitting: Emergency Medicine

## 2015-08-25 ENCOUNTER — Emergency Department
Admission: EM | Admit: 2015-08-25 | Discharge: 2015-08-25 | Disposition: A | Discharge status: Home / Self Care | Payer: Medicare Other | Attending: Emergency Medicine | Admitting: Emergency Medicine

## 2015-08-25 DIAGNOSIS — Z3202 Encounter for pregnancy test, result negative: Secondary | ICD-10-CM | POA: Diagnosis not present

## 2015-08-25 DIAGNOSIS — Z792 Long term (current) use of antibiotics: Secondary | ICD-10-CM | POA: Insufficient documentation

## 2015-08-25 DIAGNOSIS — Z21 Asymptomatic human immunodeficiency virus [HIV] infection status: Secondary | ICD-10-CM | POA: Diagnosis not present

## 2015-08-25 DIAGNOSIS — E119 Type 2 diabetes mellitus without complications: Secondary | ICD-10-CM | POA: Insufficient documentation

## 2015-08-25 DIAGNOSIS — Z880 Allergy status to penicillin: Secondary | ICD-10-CM | POA: Insufficient documentation

## 2015-08-25 DIAGNOSIS — R0981 Nasal congestion: Secondary | ICD-10-CM | POA: Diagnosis present

## 2015-08-25 DIAGNOSIS — N390 Urinary tract infection, site not specified: Secondary | ICD-10-CM

## 2015-08-25 DIAGNOSIS — B349 Viral infection, unspecified: Secondary | ICD-10-CM | POA: Diagnosis not present

## 2015-08-25 DIAGNOSIS — Z720 Tobacco use: Secondary | ICD-10-CM | POA: Insufficient documentation

## 2015-08-25 LAB — URINALYSIS COMPLETE WITH MICROSCOPIC (ARMC ONLY)
BILIRUBIN URINE: NEGATIVE
Bacteria, UA: NONE SEEN
GLUCOSE, UA: NEGATIVE mg/dL
Hgb urine dipstick: NEGATIVE
Nitrite: NEGATIVE
Protein, ur: NEGATIVE mg/dL
Specific Gravity, Urine: 1.024 (ref 1.005–1.030)
pH: 6 (ref 5.0–8.0)

## 2015-08-25 LAB — BASIC METABOLIC PANEL
ANION GAP: 8 (ref 5–15)
BUN: 13 mg/dL (ref 6–20)
CALCIUM: 9.3 mg/dL (ref 8.9–10.3)
CO2: 24 mmol/L (ref 22–32)
CREATININE: 0.78 mg/dL (ref 0.44–1.00)
Chloride: 105 mmol/L (ref 101–111)
GFR calc Af Amer: >60 mL/min (ref >60)
GLUCOSE: 89 mg/dL (ref 65–99)
Potassium: 3.4 mmol/L — ABNORMAL LOW (ref 3.5–5.1)
Sodium: 137 mmol/L (ref 135–145)

## 2015-08-25 LAB — CBC WITH DIFFERENTIAL/PLATELET
Basophils Absolute: 0.1 10*3/uL (ref 0–0.1)
Basophils Relative: 1 %
EOS PCT: 3 %
Eosinophils Absolute: 0.3 10*3/uL (ref 0–0.7)
HCT: 38.9 % (ref 35.0–47.0)
Hemoglobin: 13.3 g/dL (ref 12.0–16.0)
LYMPHS PCT: 36 %
Lymphs Abs: 3.4 10*3/uL (ref 1.0–3.6)
MCH: 29.2 pg (ref 26.0–34.0)
MCHC: 34.2 g/dL (ref 32.0–36.0)
MCV: 85.5 fL (ref 80.0–100.0)
MONO ABS: 0.8 10*3/uL (ref 0.2–0.9)
MONOS PCT: 9 %
Neutro Abs: 5 10*3/uL (ref 1.4–6.5)
Neutrophils Relative %: 51 %
PLATELETS: 261 10*3/uL (ref 150–440)
RBC: 4.55 MIL/uL (ref 3.80–5.20)
RDW: 13.9 % (ref 11.5–14.5)
WBC: 9.7 10*3/uL (ref 3.6–11.0)

## 2015-08-25 LAB — POCT PREGNANCY, URINE: Preg Test, Ur: NEGATIVE

## 2015-08-25 LAB — PREGNANCY, URINE: Preg Test, Ur: NEGATIVE

## 2015-08-25 MED ORDER — CEPHALEXIN 500 MG PO CAPS
500.0000 mg | ORAL_CAPSULE | Freq: Once | ORAL | Status: AC
  Administered 2015-08-25: 500 mg via ORAL

## 2015-08-25 MED ORDER — CEPHALEXIN 500 MG PO CAPS
ORAL_CAPSULE | ORAL | Status: AC
  Administered 2015-08-25: 500 mg via ORAL
  Filled 2015-08-25: qty 1

## 2015-08-25 MED ORDER — CEPHALEXIN 500 MG PO CAPS: 500.0000 mg | ORAL_CAPSULE | Freq: Two times a day (BID) | ORAL | Status: DC

## 2015-08-25 NOTE — ED Provider Notes (Signed)
[facility] Emergency Department Provider Note  ____________________________________________  Time seen: Approximately 5:17 PM  I have reviewed the triage vital signs and the nursing notes.   HISTORY  Chief Complaint Generalized Body Aches and Nasal Congestion    HPI Kristen Villegas is a 39 y.o. female with a history of HIV who is seen at the [facility] infectious disease clinic who presents complaining of upper respiratory/"cold" symptoms for about 2 weeks including congestion and cough.  She also states that for about the same time she has been having generalized body aches.  She is having some mild dysuria.  The onset of the symptoms as gradual.  The issue that concerns her the most is the cough.   She was seen a couple of days ago at [facility] and was started on Macrobid empirically for UTI because she cannot produce a urine sample at that time.  She also had a pelvic exam due to one episode of unprotected intercourse but the wet prep and GC/chlamydia tests were negative.  Past Medical History  Diagnosis Date  . HIV disease   . Seizures   . Diabetes mellitus without complication   . Collapsed lung     There are no active problems to display for this patient.   Past Surgical History  Procedure Laterality Date  . Lung surgery      Current Outpatient Rx  Name  Route  Sig  Dispense  Refill  . clindamycin (CLEOCIN) 150 MG capsule   Oral   Take 1 capsule (150 mg total) by mouth 4 (four) times daily.   40 capsule   0   . ibuprofen (ADVIL,MOTRIN) 800 MG tablet   Oral   Take 1 tablet (800 mg total) by mouth every 8 (eight) hours as needed for moderate pain.   15 tablet   0   .           . oxyCODONE-acetaminophen (PERCOCET) 7.5-325 MG per tablet   Oral   Take 1 tablet by mouth every 6 (six) hours as needed for severe pain.   12 tablet   0     Allergies Penicillins  No family history on file.  Social History Social History  Substance Use  Topics  . Smoking status: Current Every Day Smoker -- 0.20 packs/day    Types: Cigarettes  . Smokeless tobacco: None  . Alcohol Use: No    Review of Systems Constitutional: Subjective fever Eyes: No visual changes. ENT: Mild sore throat.  Congestion, drainage," stuffy nose"  Cardiovascular: Denies chest pain. Respiratory: Denies shortness of breath.  Frequent cough Gastrointestinal: No abdominal pain.  No nausea, no vomiting.  No diarrhea.  No constipation. Genitourinary: Positive for dysuria Musculoskeletal: Negative for back pain. Skin: Negative for rash. Neurological: Negative for headaches, focal weakness or numbness.  10-point ROS otherwise negative.  ____________________________________________   PHYSICAL EXAM:  VITAL SIGNS: ED Triage Vitals  Enc Vitals Group     BP 08/25/15 1546 99/69 mmHg     Pulse Rate 08/25/15 1546 76     Resp 08/25/15 1546 20     Temp 08/25/15 1546 98.3 F (36.8 C)     Temp Source 08/25/15 1546 Oral     SpO2 08/25/15 1546 96 %     Weight 08/25/15 1546 205 lb (92.987 kg)     Height 08/25/15 1546 5\' 5"  (1.651 m)     Head Cir --      Peak Flow --  Pain Score 08/25/15 1548 9     Pain Loc --      Pain Edu? --      Excl. in GC? --     Constitutional: Alert and oriented. Well appearing and in no acute distress. Eyes: Conjunctivae are normal. PERRL. EOMI. Head: Atraumatic. Nose: No congestion/rhinnorhea. Mouth/Throat: Mucous membranes are moist.  Oropharynx non-erythematous.  Poor dentition Neck: No stridor.   Cardiovascular: Normal rate, regular rhythm. Grossly normal heart sounds.  Good peripheral circulation. Respiratory: Normal respiratory effort.  No retractions. Lungs CTAB. Gastrointestinal: Soft and nontender. No distention. No abdominal bruits. No CVA tenderness. Musculoskeletal: No lower extremity tenderness nor edema.  No joint effusions. Neurologic:  Normal speech and language. No gross focal neurologic deficits are  appreciated.  Skin:  Skin is warm, dry and intact. No rash noted. Psychiatric: Mood and affect are flat. Speech and behavior are normal.  ____________________________________________   LABS (all labs ordered are listed, but only abnormal results are displayed)  Labs Reviewed  BASIC METABOLIC PANEL - Abnormal; Notable for the following:    Potassium 3.4 (*)    All other components within normal limits  URINALYSIS COMPLETEWITH MICROSCOPIC ([facility] ONLY) - Abnormal; Notable for the following:    Color, Urine AMBER (*)    APPearance CLEAR (*)    Ketones, ur TRACE (*)    Leukocytes, UA 1+ (*)    Squamous Epithelial / LPF 0-5 (*)    All other components within normal limits  URINE CULTURE  CBC WITH DIFFERENTIAL/PLATELET  PREGNANCY, URINE  POCT PREGNANCY, URINE   ____________________________________________  EKG  Not indicated ____________________________________________  RADIOLOGY   Dg Chest 2 View  08/25/2015   CLINICAL DATA:  Cough. Congestion. Fever and body aches for 2 weeks. HIV.  EXAM: CHEST  2 VIEW  COMPARISON:  08/04/2013  FINDINGS: Persistent linear opacity peripherally at the right lung base favoring scarring. The lungs appear otherwise clear.  Cardiac and mediastinal margins appear normal.  No pleural effusion.  IMPRESSION: 1. Linear opacity peripherally at the right lung base, compatible with mild scarring. Otherwise, no significant abnormalities are observed.   Electronically Signed   By: Gaylyn Rong M.D.   On: 08/25/2015 16:30    ____________________________________________   PROCEDURES  Procedure(s) performed: None  Critical Care performed: No ____________________________________________   INITIAL IMPRESSION / ASSESSMENT AND PLAN / ED COURSE  Pertinent labs & imaging results that were available during my care of the patient were reviewed by me and considered in my medical decision making (see chart for details).  The patient is not ill-appearing  and is in no acute distress.  She was seen within the last several days at the infectious disease clinic and I read the note via the care everywhere.  In spite of her impaired treatment with Macrobid, she does appear to still have a urinary tract infection which I will treat with Keflex.  I told her verbally and in the instructions that she should stop taking the Macrobid and take the Keflex instead.  I am not concerned for any severe infection which may or may not be related to her HIV, and nor was the infectious disease clinic when I saw her.  I prescribed Tussionex for her cough and recommended that she follow up next week with the infectious disease clinic rather than waiting for another month.  I also recommended that she return to the emergency department with any new or worsening symptoms.  ____________________________________________  FINAL CLINICAL IMPRESSION(S) / ED DIAGNOSES  Final diagnoses:  UTI (lower urinary tract infection)  Viral syndrome      NEW MEDICATIONS STARTED DURING THIS VISIT:  Discharge Medication List as of 08/25/2015  6:54 PM    START taking these medications   Details  cephALEXin (KEFLEX) 500 MG capsule Take 1 capsule (500 mg total) by mouth 2 (two) times daily., Starting 08/25/2015, Until Discontinued, Print         Loleta Rose, MD 08/25/15 2039

## 2015-08-25 NOTE — Discharge Instructions (Signed)
You have been seen in the Emergency Department (ED) today for symptoms that suggest both a viral infection (like a cold) and a urinary tract infection (UTI).  Please take your antibiotic as prescribed and over-the-counter pain medication (Tylenol or Motrin) as needed, but no more than recommended on the label instructions.  Drink PLENTY of fluids.  We recommend that you stop taking the antibiotic you were recently prescribed (Macrobid, or nitrofurantoin) and start taking the new one we prescribed tonight instead.  Call your regular doctor to schedule the next available appointment to follow up on todays ED visit, or return immediately to the ED if your pain worsens, you have decreased urine production, develop fever, persistent vomiting, or other symptoms that concern you.   Urinary Tract Infection Urinary tract infections (UTIs) can develop anywhere along your urinary tract. Your urinary tract is your body's drainage system for removing wastes and extra water. Your urinary tract includes two kidneys, two ureters, a bladder, and a urethra. Your kidneys are a pair of bean-shaped organs. Each kidney is about the size of your fist. They are located below your ribs, one on each side of your spine. CAUSES Infections are caused by microbes, which are microscopic organisms, including fungi, viruses, and bacteria. These organisms are so small that they can only be seen through a microscope. Bacteria are the microbes that most commonly cause UTIs. SYMPTOMS  Symptoms of UTIs may vary by age and gender of the patient and by the location of the infection. Symptoms in young women typically include a frequent and intense urge to urinate and a painful, burning feeling in the bladder or urethra during urination. Older women and men are more likely to be tired, shaky, and weak and have muscle aches and abdominal pain. A fever may mean the infection is in your kidneys. Other symptoms of a kidney infection include pain in  your back or sides below the ribs, nausea, and vomiting. DIAGNOSIS To diagnose a UTI, your caregiver will ask you about your symptoms. Your caregiver also will ask to provide a urine sample. The urine sample will be tested for bacteria and white blood cells. White blood cells are made by your body to help fight infection. TREATMENT  Typically, UTIs can be treated with medication. Because most UTIs are caused by a bacterial infection, they usually can be treated with the use of antibiotics. The choice of antibiotic and length of treatment depend on your symptoms and the type of bacteria causing your infection. HOME CARE INSTRUCTIONS  If you were prescribed antibiotics, take them exactly as your caregiver instructs you. Finish the medication even if you feel better after you have only taken some of the medication.  Drink enough water and fluids to keep your urine clear or pale yellow.  Avoid caffeine, tea, and carbonated beverages. They tend to irritate your bladder.  Empty your bladder often. Avoid holding urine for long periods of time.  Empty your bladder before and after sexual intercourse.  After a bowel movement, women should cleanse from front to back. Use each tissue only once. SEEK MEDICAL CARE IF:   You have back pain.  You develop a fever.  Your symptoms do not begin to resolve within 3 days. SEEK IMMEDIATE MEDICAL CARE IF:   You have severe back pain or lower abdominal pain.  You develop chills.  You have nausea or vomiting.  You have continued burning or discomfort with urination. MAKE SURE YOU:   Understand these instructions.  Will watch  your condition.  Will get help right away if you are not doing well or get worse. Document Released: 09/26/2005 Document Revised: 06/17/2012 Document Reviewed: 01/25/2012 [facility] Patient Information 2015 [place], [place]. This information is not intended to replace advice given to you by your health care provider. Make sure you  discuss any questions you have with your health care provider.   Viral Infections A viral infection can be caused by different types of viruses.Most viral infections are not serious and resolve on their own. However, some infections may cause severe symptoms and may lead to further complications. SYMPTOMS Viruses can frequently cause:  Minor sore throat.  Aches and pains.  Headaches.  Runny nose.  Different types of rashes.  Watery eyes.  Tiredness.  Cough.  Loss of appetite.  Gastrointestinal infections, resulting in nausea, vomiting, and diarrhea. These symptoms do not respond to antibiotics because the infection is not caused by bacteria. However, you might catch a bacterial infection following the viral infection. This is sometimes called a "superinfection." Symptoms of such a bacterial infection may include:  Worsening sore throat with pus and difficulty swallowing.  Swollen neck glands.  Chills and a high or persistent fever.  Severe headache.  Tenderness over the sinuses.  Persistent overall ill feeling (malaise), muscle aches, and tiredness (fatigue).  Persistent cough.  Yellow, green, or brown mucus production with coughing. HOME CARE INSTRUCTIONS   Only take over-the-counter or prescription medicines for pain, discomfort, diarrhea, or fever as directed by your caregiver.  Drink enough water and fluids to keep your urine clear or pale yellow. Sports drinks can provide valuable electrolytes, sugars, and hydration.  Get plenty of rest and maintain proper nutrition. Soups and broths with crackers or rice are fine. SEEK IMMEDIATE MEDICAL CARE IF:   You have severe headaches, shortness of breath, chest pain, neck pain, or an unusual rash.  You have uncontrolled vomiting, diarrhea, or you are unable to keep down fluids.  You or your child has an oral temperature above 102 F (38.9 C), not controlled by medicine.  Your baby is older than 3 months with a  rectal temperature of 102 F (38.9 C) or higher.  Your baby is 69 months old or younger with a rectal temperature of 100.4 F (38 C) or higher. MAKE SURE YOU:   Understand these instructions.  Will watch your condition.  Will get help right away if you are not doing well or get worse. Document Released: 09/26/2005 Document Revised: 03/10/2012 Document Reviewed: 04/23/2011 [facility] Patient Information 2015 [place], [place]. This information is not intended to replace advice given to you by your health care provider. Make sure you discuss any questions you have with your health care provider.

## 2015-08-25 NOTE — ED Notes (Signed)
Brought in via ems for with cold sx's for 2 weeks. Has been seen by her md and given Mucinex w/o relief..the patient lives [facility]

## 2015-08-25 NOTE — ED Notes (Signed)
Pt states cold symptoms for 2 weeks, congestion and cough, pt also states she is HIV positive and on medication for an STD, pt in no distress

## 2015-08-25 NOTE — ED Notes (Signed)
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  Patient is in no obvious distress at this time.  Respirations even and non-labored.

## 2015-08-27 LAB — URINE CULTURE: Special Requests: NORMAL

## 2015-09-20 DIAGNOSIS — R300 Dysuria: Secondary | ICD-10-CM | POA: Insufficient documentation

## 2016-02-07 DIAGNOSIS — F17200 Nicotine dependence, unspecified, uncomplicated: Secondary | ICD-10-CM | POA: Insufficient documentation

## 2016-06-21 ENCOUNTER — Encounter: Payer: Self-pay | Admitting: Podiatry

## 2016-06-21 ENCOUNTER — Ambulatory Visit (INDEPENDENT_AMBULATORY_CARE_PROVIDER_SITE_OTHER): Payer: Medicare Other | Admitting: Podiatry

## 2016-06-21 VITALS — BP 96/68 | HR 83 | Resp 18

## 2016-06-21 DIAGNOSIS — M79675 Pain in left toe(s): Secondary | ICD-10-CM

## 2016-06-21 DIAGNOSIS — M79674 Pain in right toe(s): Secondary | ICD-10-CM

## 2016-06-21 DIAGNOSIS — B351 Tinea unguium: Secondary | ICD-10-CM

## 2016-06-21 NOTE — Progress Notes (Signed)
   Subjective:    Patient ID: Kristen Villegas, female    DOB: 06/19/1976, 40 y.o.   MRN: [id number]  HPI  40 year old female presents today with caregivers for concerns of pain to her toenails which been ongoing. She says her nails are painful. She wouldn't pressure. Denies any redness or drainage or any swelling to the toenails. No other complaints this time.   Review of Systems  All other systems reviewed and are negative.      Objective:   Physical Exam General: AAO x3, NAD  Dermatological: Nails are hypertrophic, dystrophic, brittle, discolored, elongated 10. No surrounding redness or drainage. Tenderness nails 1-5 bilaterally. No open lesions of breath or lesions.  Vascular: Dorsalis Pedis artery and Posterior Tibial artery pedal pulses are 2/4 bilateral with immedate capillary fill time. Pedal hair growth present.  There is no pain with calf compression, swelling, warmth, erythema.   Neruologic: Grossly intact via light touch bilateral. Vibratory intact via tuning fork bilateral. Protective threshold with Semmes Wienstein monofilament intact to all pedal sites bilateral.   Musculoskeletal: No gross boney pedal deformities bilateral. No pain, crepitus, or limitation noted with foot and ankle range of motion bilateral. Muscular strength 5/5 in all groups tested bilateral.  Gait: Unassisted, Nonantalgic.      Assessment & Plan:  40 year old female with symptomatic onychomycosis -Treatment options discussed including all alternatives, risks, and complications -Etiology of symptoms were discussed -Nails debrided 10 without complications or bleeding. -Discussed treatment options for nail fungus. She will hold off for now.  -Daily foot inspection -Follow-up in 3 months or sooner if any problems arise. In the meantime, encouraged to call the office with any questions, concerns, change in symptoms.   Ovid CurdMatthew Wagoner, DPM

## 2016-09-18 ENCOUNTER — Ambulatory Visit (INDEPENDENT_AMBULATORY_CARE_PROVIDER_SITE_OTHER): Payer: Medicare Other | Admitting: Podiatry

## 2016-09-18 ENCOUNTER — Ambulatory Visit: Payer: Medicare Other | Admitting: Podiatry

## 2016-09-18 ENCOUNTER — Encounter: Payer: Self-pay | Admitting: Podiatry

## 2016-09-18 DIAGNOSIS — B351 Tinea unguium: Secondary | ICD-10-CM

## 2016-09-18 DIAGNOSIS — M79674 Pain in right toe(s): Secondary | ICD-10-CM | POA: Diagnosis not present

## 2016-09-18 DIAGNOSIS — M79675 Pain in left toe(s): Secondary | ICD-10-CM | POA: Diagnosis not present

## 2016-09-18 NOTE — Patient Instructions (Signed)
Diabetes and Foot Care Diabetes may cause you to have problems because of poor blood supply (circulation) to your feet and legs. This may cause the skin on your feet to become thinner, break easier, and heal more slowly. Your skin may become dry, and the skin may peel and crack. You may also have nerve damage in your legs and feet causing decreased feeling in them. You may not notice minor injuries to your feet that could lead to infections or more serious problems. Taking care of your feet is one of the most important things you can do for yourself.  HOME CARE INSTRUCTIONS  Wear shoes at all times, even in the house. Do not go barefoot. Bare feet are easily injured.  Check your feet daily for blisters, cuts, and redness. If you cannot see the bottom of your feet, use a mirror or ask someone for help.  Wash your feet with warm water (do not use hot water) and mild soap. Then pat your feet and the areas between your toes until they are completely dry. Do not soak your feet as this can dry your skin.  Apply a moisturizing lotion or petroleum jelly (that does not contain alcohol and is unscented) to the skin on your feet and to dry, brittle toenails. Do not apply lotion between your toes.  Trim your toenails straight across. Do not dig under them or around the cuticle. File the edges of your nails with an emery board or nail file.  Do not cut corns or calluses or try to remove them with medicine.  Wear clean socks or stockings every day. Make sure they are not too tight. Do not wear knee-high stockings since they may decrease blood flow to your legs.  Wear shoes that fit properly and have enough cushioning. To break in new shoes, wear them for just a few hours a day. This prevents you from injuring your feet. Always look in your shoes before you put them on to be sure there are no objects inside.  Do not cross your legs. This may decrease the blood flow to your feet.  If you find a minor scrape,  cut, or break in the skin on your feet, keep it and the skin around it clean and dry. These areas may be cleansed with mild soap and water. Do not cleanse the area with peroxide, alcohol, or iodine.  When you remove an adhesive bandage, be sure not to damage the skin around it.  If you have a wound, look at it several times a day to make sure it is healing.  Do not use heating pads or hot water bottles. They may burn your skin. If you have lost feeling in your feet or legs, you may not know it is happening until it is too late.  Make sure your health care provider performs a complete foot exam at least annually or more often if you have foot problems. Report any cuts, sores, or bruises to your health care provider immediately. SEEK MEDICAL CARE IF:   You have an injury that is not healing.  You have cuts or breaks in the skin.  You have an ingrown nail.  You notice redness on your legs or feet.  You feel burning or tingling in your legs or feet.  You have pain or cramps in your legs and feet.  Your legs or feet are numb.  Your feet always feel cold. SEEK IMMEDIATE MEDICAL CARE IF:   There is increasing redness,   swelling, or pain in or around a wound.  There is a red line that goes up your leg.  Pus is coming from a wound.  You develop a fever or as directed by your health care provider.  You notice a bad smell coming from an ulcer or wound.   This information is not intended to replace advice given to you by your health care provider. Make sure you discuss any questions you have with your health care provider.   Document Released: 12/14/2000 Document Revised: 08/19/2013 Document Reviewed: 05/26/2013 [employer] Interactive Patient Education 2016 [employer]  

## 2016-09-18 NOTE — Progress Notes (Signed)
SUBJECTIVE Patient with a history of diabetes mellitus presents to office today complaining of elongated, thickened nails. Pain while ambulating in shoes. Patient is unable to trim their own nails.   Allergies  Allergen Reactions  . Penicillins Nausea And Vomiting    OBJECTIVE General Patient is awake, alert, and oriented x 3 and in no acute distress. Derm Skin is dry and supple bilateral. Negative open lesions or macerations. Remaining integument unremarkable. Nails are tender, long, thickened and dystrophic with subungual debris, consistent with onychomycosis, 1-5 bilateral. No signs of infection noted. Vasc  DP and PT pedal pulses palpable bilaterally. Temperature gradient within normal limits.  Neuro Epicritic and protective threshold sensation diminished bilaterally.  Musculoskeletal Exam No symptomatic pedal deformities noted bilateral. Muscular strength within normal limits.  ASSESSMENT 1. Diabetes Mellitus w/ peripheral neuropathy 2. Onychomycosis of nail due to dermatophyte bilateral 3. Pain in foot bilateral  PLAN OF CARE Patient evaluated today. Instructed to maintain good pedal hygiene and foot care. Stressed importance of controlling blood sugar.  Mechanical debridement of nails 1-5 bilaterally performed using a nail nipper. Filed with dremel without incident.  All patient questions were answered. Return to clinic in 3 mos.    Felecia ShellingBrent M Timithy Arons, DPM

## 2016-11-14 ENCOUNTER — Ambulatory Visit: Payer: Self-pay | Admitting: General Surgery

## 2016-11-21 ENCOUNTER — Emergency Department: Payer: Medicare Other

## 2016-11-21 ENCOUNTER — Encounter: Payer: Self-pay | Admitting: Emergency Medicine

## 2016-11-21 ENCOUNTER — Emergency Department
Admission: EM | Admit: 2016-11-21 | Discharge: 2016-11-21 | Disposition: A | Discharge status: Home / Self Care | Payer: Medicare Other | Attending: Emergency Medicine | Admitting: Emergency Medicine

## 2016-11-21 DIAGNOSIS — R05 Cough: Secondary | ICD-10-CM | POA: Diagnosis present

## 2016-11-21 DIAGNOSIS — E119 Type 2 diabetes mellitus without complications: Secondary | ICD-10-CM | POA: Diagnosis not present

## 2016-11-21 DIAGNOSIS — J069 Acute upper respiratory infection, unspecified: Secondary | ICD-10-CM | POA: Diagnosis not present

## 2016-11-21 DIAGNOSIS — F17210 Nicotine dependence, cigarettes, uncomplicated: Secondary | ICD-10-CM | POA: Diagnosis not present

## 2016-11-21 DIAGNOSIS — Z79899 Other long term (current) drug therapy: Secondary | ICD-10-CM | POA: Insufficient documentation

## 2016-11-21 DIAGNOSIS — Z21 Asymptomatic human immunodeficiency virus [HIV] infection status: Secondary | ICD-10-CM | POA: Diagnosis not present

## 2016-11-21 DIAGNOSIS — N390 Urinary tract infection, site not specified: Secondary | ICD-10-CM | POA: Diagnosis not present

## 2016-11-21 LAB — URINALYSIS COMPLETE WITH MICROSCOPIC (ARMC ONLY)
BACTERIA UA: NONE SEEN
Bilirubin Urine: NEGATIVE
GLUCOSE, UA: NEGATIVE mg/dL
HGB URINE DIPSTICK: NEGATIVE
KETONES UR: NEGATIVE mg/dL
NITRITE: POSITIVE — AB
Protein, ur: 30 mg/dL — AB
SPECIFIC GRAVITY, URINE: 1.024 (ref 1.005–1.030)
pH: 5 (ref 5.0–8.0)

## 2016-11-21 LAB — COMPREHENSIVE METABOLIC PANEL
ALT: 14 U/L (ref 14–54)
AST: 26 U/L (ref 15–41)
Albumin: 3.6 g/dL (ref 3.5–5.0)
Alkaline Phosphatase: 72 U/L (ref 38–126)
Anion gap: 9 (ref 5–15)
BILIRUBIN TOTAL: 0.3 mg/dL (ref 0.3–1.2)
BUN: 10 mg/dL (ref 6–20)
CHLORIDE: 105 mmol/L (ref 101–111)
CO2: 20 mmol/L — ABNORMAL LOW (ref 22–32)
Calcium: 8.8 mg/dL — ABNORMAL LOW (ref 8.9–10.3)
Creatinine, Ser: 0.89 mg/dL (ref 0.44–1.00)
Glucose, Bld: 158 mg/dL — ABNORMAL HIGH (ref 65–99)
POTASSIUM: 3.3 mmol/L — AB (ref 3.5–5.1)
Sodium: 134 mmol/L — ABNORMAL LOW (ref 135–145)
TOTAL PROTEIN: 7.6 g/dL (ref 6.5–8.1)

## 2016-11-21 LAB — CBC
HEMATOCRIT: 36.3 % (ref 35.0–47.0)
Hemoglobin: 12.7 g/dL (ref 12.0–16.0)
MCH: 32.5 pg (ref 26.0–34.0)
MCHC: 35.2 g/dL (ref 32.0–36.0)
MCV: 92.4 fL (ref 80.0–100.0)
PLATELETS: 193 10*3/uL (ref 150–440)
RBC: 3.92 MIL/uL (ref 3.80–5.20)
RDW: 14.1 % (ref 11.5–14.5)
WBC: 12.1 10*3/uL — AB (ref 3.6–11.0)

## 2016-11-21 LAB — LIPASE, BLOOD: LIPASE: 22 U/L (ref 11–51)

## 2016-11-21 LAB — POCT PREGNANCY, URINE: PREG TEST UR: NEGATIVE

## 2016-11-21 MED ORDER — ONDANSETRON 4 MG PO TBDP
4.0000 mg | ORAL_TABLET | Freq: Once | ORAL | Status: AC
  Administered 2016-11-21: 4 mg via ORAL

## 2016-11-21 MED ORDER — SODIUM CHLORIDE 0.9 % IV BOLUS (SEPSIS)
1000.0000 mL | Freq: Once | INTRAVENOUS | Status: AC
  Administered 2016-11-21: 1000 mL via INTRAVENOUS

## 2016-11-21 MED ORDER — ONDANSETRON 4 MG PO TBDP
ORAL_TABLET | ORAL | Status: AC
  Filled 2016-11-21: qty 1

## 2016-11-21 MED ORDER — IOPAMIDOL (ISOVUE-300) INJECTION 61%
75.0000 mL | Freq: Once | INTRAVENOUS | Status: AC | PRN
  Administered 2016-11-21: 75 mL via INTRAVENOUS
  Filled 2016-11-21: qty 75

## 2016-11-21 MED ORDER — LEVOFLOXACIN 750 MG PO TABS: 750.0000 mg | ORAL_TABLET | Freq: Every day | ORAL | 0 refills | Status: AC

## 2016-11-21 MED ORDER — PROMETHAZINE HCL 12.5 MG PO TABS: 12.5000 mg | ORAL_TABLET | Freq: Four times a day (QID) | ORAL | 0 refills | Status: DC | PRN

## 2016-11-21 MED ORDER — LEVOFLOXACIN IN D5W 750 MG/150ML IV SOLN
750.0000 mg | Freq: Once | INTRAVENOUS | Status: AC
  Administered 2016-11-21: 750 mg via INTRAVENOUS
  Filled 2016-11-21 (×2): qty 150

## 2016-11-21 NOTE — Discharge Instructions (Signed)
Return to the emergency room for any new or worrisome symptoms, including pain in her side, high fever, shortness of breath, worsening cough, weakness or if you feel worse in any way including persistent vomiting. Take the antibiotics until it is gone. We did do a CT scan here, please bring these results to your doctor in the next few days.  Partially calcified pleural plaque formation/pleural nodularity throughout the RIGHT hemithorax.   In 2014 patient had a large loculated pneumothorax, chest tube, and severe interstitial lung disease changes which have improved, with minimal residual scarring in the RIGHT upper lobe.   The observed RIGHT pleural process may represent sequela of prior empyema, pneumothorax, or TB.   Calcified granuloma RIGHT lower lobe.   No definite parenchymal lung nodules otherwise identified.   Mild premature coronary arterial calcification at for age.

## 2016-11-21 NOTE — ED Notes (Signed)
Sore throat, headache, nausea and vomiting after eating beginning yesterday.

## 2016-11-21 NOTE — ED Notes (Signed)
Patient transported to X-ray 

## 2016-11-21 NOTE — ED Notes (Signed)
Pt up to bathroom.

## 2016-11-21 NOTE — ED Notes (Signed)
ED Provider at bedside. 

## 2016-11-21 NOTE — ED Triage Notes (Signed)
Patient to ER via [facility] for c/o emesis x2 today. Patient reports having URI symptoms (cough, nasal congestion and drainage, and sore throat) x5 days. Denies coughing prior to emesis episodes. States she had just eaten lunch, went to restroom and became nauseated and vomited. States in total has had 4 emesis episodes (2 last night, 2 today), all after eating.

## 2016-11-21 NOTE — ED Notes (Signed)
Patient transported to CT 

## 2016-11-21 NOTE — ED Notes (Signed)
Pt going back to assisted living facility. Called number, no answer at this time.

## 2016-11-21 NOTE — ED Provider Notes (Addendum)
[facility] Emergency Department Provider Note  ____________________________________________   I have reviewed the triage vital signs and the nursing notes.   HISTORY  Chief Complaint Emesis and URI    HPI Kristen Villegas is a 40 y.o. female presents today with URI symptoms. Cough and cold symptoms. She has not had a fever. She has had a runny nose, slight cough, and she vomited a few times. No melena bright red blood per rectum no diarrhea no abdominal pain. They started yesterday or the day before.   Past Medical History:  Diagnosis Date  . Collapsed lung   . Diabetes mellitus without complication (HCC)   . HIV disease (HCC)   . Seizures (HCC)     There are no active problems to display for this patient.   Past Surgical History:  Procedure Laterality Date  . LUNG SURGERY      Prior to Admission medications   Medication Sig Start Date End Date Taking? Authorizing Provider  buPROPion (WELLBUTRIN XL) 300 MG 24 hr tablet Take 300 mg by mouth.    Historical Provider, MD  cephALEXin (KEFLEX) 500 MG capsule Take 1 capsule (500 mg total) by mouth 2 (two) times daily. 08/25/15   Loleta Roseory Forbach, MD  cetirizine (ZYRTEC) 10 MG tablet Take 10 mg by mouth.    Historical Provider, MD  clindamycin (CLEOCIN) 150 MG capsule Take 1 capsule (150 mg total) by mouth 4 (four) times daily. 07/09/15   Joni Reiningonald K Smith, PA-C  elvitegravir-cobicistat-emtricitabine-tenofovir (STRIBILD) 150-150-200-300 MG TABS tablet Take by mouth. 03/26/16   Historical Provider, MD  ibuprofen (ADVIL,MOTRIN) 800 MG tablet Take 1 tablet (800 mg total) by mouth every 8 (eight) hours as needed for moderate pain. 07/09/15   Joni Reiningonald K Smith, PA-C  levETIRAcetam (KEPPRA) 1000 MG tablet Take 1,000 mg by mouth. 03/27/16 03/27/17  Historical Provider, MD  nicotine (NICODERM CQ) 14 mg/24hr patch Place onto the skin. 01/03/16   Historical Provider, MD  nicotine (NICODERM CQ) 21 mg/24hr patch Place onto the skin.  01/03/16   Historical Provider, MD  nicotine (NICODERM CQ) 7 mg/24hr patch Place onto the skin. 01/03/16   Historical Provider, MD  oxyCODONE-acetaminophen (PERCOCET) 7.5-325 MG per tablet Take 1 tablet by mouth every 6 (six) hours as needed for severe pain. 07/09/15   Joni Reiningonald K Smith, PA-C  risperiDONE (RISPERDAL) 2 MG tablet Take 2 mg by mouth.    Historical Provider, MD    Allergies Penicillins  No family history on file.  Social History Social History  Substance Use Topics  . Smoking status: Current Every Day Smoker    Packs/day: 0.20    Types: Cigarettes  . Smokeless tobacco: Never Used  . Alcohol use No    Review of Systems Constitutional: No fever/chills Eyes: No visual changes. ENT: No sore throat. No stiff neck no neck pain Cardiovascular: Denies chest pain. Respiratory: Denies shortness of breath. Gastrointestinal:   no vomiting.  No diarrhea.  No constipation. Genitourinary: Negative for dysuria. Musculoskeletal: Negative lower extremity swelling Skin: Negative for rash. Neurological: Negative for severe headaches, focal weakness or numbness. 10-point ROS otherwise negative.  ____________________________________________   PHYSICAL EXAM:  VITAL SIGNS: ED Triage Vitals  Enc Vitals Group     BP 11/21/16 1351 101/68     Pulse Rate 11/21/16 1351 87     Resp 11/21/16 1351 20     Temp 11/21/16 1351 98.3 F (36.8 C)     Temp Source 11/21/16 1351 Oral     SpO2  11/21/16 1351 95 %     Weight 11/21/16 1352 215 lb (97.5 kg)     Height 11/21/16 1352 5\' 5"  (1.651 m)     Head Circumference --      Peak Flow --      Pain Score 11/21/16 1352 8     Pain Loc --      Pain Edu? --      Excl. in GC? --     Constitutional: Alert and oriented. Well appearing and in no acute distress. Eyes: Conjunctivae are normal. PERRL. EOMI. Head: Atraumatic. Nose: Positive mild  congestion/rhinnorhea. Mouth/Throat: Mucous membranes are moist.  Oropharynx non-erythematous. Neck: No  stridor.   Nontender with no meningismus Cardiovascular: Normal rate, regular rhythm. Grossly normal heart sounds.  Good peripheral circulation. Respiratory: Normal respiratory effort.  No retractions. Lungs CTAB. Abdominal: Soft and nontender. No distention. No guarding no rebound Back:  There is no focal tenderness or step off.  there is no midline tenderness there are no lesions noted. there is no CVA tenderness Musculoskeletal: No lower extremity tenderness, no upper extremity tenderness. No joint effusions, no DVT signs strong distal pulses no edema Neurologic:  Normal speech and language. No gross focal neurologic deficits are appreciated.  Skin:  Skin is warm, dry and intact. No rash noted. Psychiatric: Mood and affect are normal. Speech and behavior are normal.  ____________________________________________   LABS (all labs ordered are listed, but only abnormal results are displayed)  Labs Reviewed  COMPREHENSIVE METABOLIC PANEL - Abnormal; Notable for the following:       Result Value   Sodium 134 (*)    Potassium 3.3 (*)    CO2 20 (*)    Glucose, Bld 158 (*)    Calcium 8.8 (*)    All other components within normal limits  CBC - Abnormal; Notable for the following:    WBC 12.1 (*)    All other components within normal limits  URINALYSIS COMPLETEWITH MICROSCOPIC ([facility] ONLY) - Abnormal; Notable for the following:    Color, Urine YELLOW (*)    APPearance HAZY (*)    Protein, ur 30 (*)    Nitrite POSITIVE (*)    Leukocytes, UA 3+ (*)    Squamous Epithelial / LPF 6-30 (*)    All other components within normal limits  LIPASE, BLOOD  POC URINE PREG, ED  POCT PREGNANCY, URINE   ____________________________________________  EKG  I personally interpreted any EKGs ordered by me or triage  ____________________________________________  RADIOLOGY  I reviewed any imaging ordered by me or triage that were performed during my shift and, if possible, patient and/or family made  aware of any abnormal findings. ____________________________________________   PROCEDURES  Procedure(s) performed: None  Procedures  Critical Care performed: None  ____________________________________________   INITIAL IMPRESSION / ASSESSMENT AND PLAN / ED COURSE  Pertinent labs & imaging results that were available during my care of the patient were reviewed by me and considered in my medical decision making (see chart for details).  Patient with URI symptoms and vomiting. Does have HIV. Does go to infectious disease. White count is 12. Given that history we will however get a chest x-ray which I don't think he normally would be indicated. Blood work is otherwise reassuring. Does appear to have a urinary tract infection. We are setting cultures. We will start her on Cipro. No prior cultures available to show any particular bacterial resistance. No evidence of the flu. Low suspicion for pneumonia.   ----------------------------------------- 4:06  PM on 11/21/2016 -----------------------------------------  Patient has no dysuria or urinary symptoms but she states she was told she had a UTI" yesterday the day before" and she was given antibiotics but has not yet taken them.  I did discuss with Dr. register who feels that the scarring has progressed and who does want a CT scan.   ----------------------------------------- 4:57 PM on 11/21/2016 -----------------------------------------  CT scan shows what appears to be a progression of her chronic illness, no acute pathology noted we'll have her follow up closely with primary care, we have made her aware of all the findings on CT. Patient munching on food and ambulating to the department in no acute distress. We have given her IV antibiotics for her UTI which she apparently failed to treat at home. We'll send her home with antibiotics for roughly which could help with both her cough and her UTI, urine culture has been sent. Patient were  well appearing with discharge.  Clinical Course    ____________________________________________   FINAL CLINICAL IMPRESSION(S) / ED DIAGNOSES  Final diagnoses:  None      This chart was dictated using voice recognition software.  Despite best efforts to proofread,  errors can occur which can change meaning.      Jeanmarie PlantJames A Shernita Rabinovich, MD 11/21/16 1542    Jeanmarie PlantJames A Slyvia Lartigue, MD 11/21/16 [phone]

## 2016-11-21 NOTE — ED Notes (Signed)
Given ginger ale for PO challenge. Pt up to bathroom.

## 2016-11-24 LAB — URINE CULTURE: Culture: >=100000 — AB

## 2017-01-01 ENCOUNTER — Ambulatory Visit (INDEPENDENT_AMBULATORY_CARE_PROVIDER_SITE_OTHER): Payer: Medicare Other | Admitting: Podiatry

## 2017-01-01 ENCOUNTER — Encounter: Payer: Self-pay | Admitting: Podiatry

## 2017-01-01 DIAGNOSIS — L603 Nail dystrophy: Secondary | ICD-10-CM

## 2017-01-01 DIAGNOSIS — M79609 Pain in unspecified limb: Principal | ICD-10-CM

## 2017-01-01 DIAGNOSIS — L608 Other nail disorders: Secondary | ICD-10-CM

## 2017-01-01 DIAGNOSIS — M79676 Pain in unspecified toe(s): Secondary | ICD-10-CM

## 2017-01-01 DIAGNOSIS — E0843 Diabetes mellitus due to underlying condition with diabetic autonomic (poly)neuropathy: Secondary | ICD-10-CM

## 2017-01-01 DIAGNOSIS — B351 Tinea unguium: Secondary | ICD-10-CM

## 2017-01-01 NOTE — Progress Notes (Signed)
SUBJECTIVE Patient with a history of diabetes mellitus presents to office today complaining of elongated, thickened nails. Pain while ambulating in shoes. Patient is unable to trim their own nails.   Allergies  Allergen Reactions  . Penicillins Nausea And Vomiting    OBJECTIVE General Patient is awake, alert, and oriented x 3 and in no acute distress. Derm Skin is dry and supple bilateral. Negative open lesions or macerations. Remaining integument unremarkable. Nails are tender, long, thickened and dystrophic with subungual debris, consistent with onychomycosis, 1-5 bilateral. No signs of infection noted. Vasc  DP and PT pedal pulses palpable bilaterally. Temperature gradient within normal limits.  Neuro Epicritic and protective threshold sensation diminished bilaterally.  Musculoskeletal Exam No symptomatic pedal deformities noted bilateral. Muscular strength within normal limits.  ASSESSMENT 1. Diabetes Mellitus w/ peripheral neuropathy 2. Onychomycosis of nail due to dermatophyte bilateral 3. Pain in foot bilateral  PLAN OF CARE 1. Patient evaluated today. 2. Instructed to maintain good pedal hygiene and foot care. Stressed importance of controlling blood sugar.  3. Mechanical debridement of nails 1-5 bilaterally performed using a nail nipper. Filed with dremel without incident.  4. Return to clinic in 3 mos.     Felecia ShellingBrent M. Evans, DPM [facility]  Dr. Felecia ShellingBrent M. Evans, DPM   [address]                                        [place], [place] [address]                Office [phone]  Fax [phone]

## 2017-01-21 ENCOUNTER — Emergency Department
Admission: EM | Admit: 2017-01-21 | Discharge: 2017-01-21 | Disposition: A | Discharge status: Home / Self Care | Payer: Medicare Other | Attending: Emergency Medicine | Admitting: Emergency Medicine

## 2017-01-21 ENCOUNTER — Encounter: Payer: Self-pay | Admitting: *Deleted

## 2017-01-21 ENCOUNTER — Emergency Department: Payer: Medicare Other

## 2017-01-21 DIAGNOSIS — S3992XA Unspecified injury of lower back, initial encounter: Secondary | ICD-10-CM | POA: Diagnosis present

## 2017-01-21 DIAGNOSIS — Y929 Unspecified place or not applicable: Secondary | ICD-10-CM | POA: Insufficient documentation

## 2017-01-21 DIAGNOSIS — E119 Type 2 diabetes mellitus without complications: Secondary | ICD-10-CM | POA: Diagnosis not present

## 2017-01-21 DIAGNOSIS — Z21 Asymptomatic human immunodeficiency virus [HIV] infection status: Secondary | ICD-10-CM | POA: Insufficient documentation

## 2017-01-21 DIAGNOSIS — W010XXA Fall on same level from slipping, tripping and stumbling without subsequent striking against object, initial encounter: Secondary | ICD-10-CM | POA: Insufficient documentation

## 2017-01-21 DIAGNOSIS — M545 Low back pain, unspecified: Secondary | ICD-10-CM

## 2017-01-21 DIAGNOSIS — Y999 Unspecified external cause status: Secondary | ICD-10-CM | POA: Insufficient documentation

## 2017-01-21 DIAGNOSIS — Y939 Activity, unspecified: Secondary | ICD-10-CM | POA: Diagnosis not present

## 2017-01-21 LAB — POCT PREGNANCY, URINE: PREG TEST UR: NEGATIVE

## 2017-01-21 MED ORDER — CYCLOBENZAPRINE HCL 10 MG PO TABS: 10.0000 mg | ORAL_TABLET | Freq: Three times a day (TID) | ORAL | 0 refills | Status: AC | PRN

## 2017-01-21 MED ORDER — OXYCODONE-ACETAMINOPHEN 5-325 MG PO TABS: 1.0000 | ORAL_TABLET | Freq: Four times a day (QID) | ORAL | 0 refills | Status: DC | PRN

## 2017-01-21 MED ORDER — CYCLOBENZAPRINE HCL 10 MG PO TABS
10.0000 mg | ORAL_TABLET | Freq: Once | ORAL | Status: AC
  Administered 2017-01-21: 10 mg via ORAL
  Filled 2017-01-21: qty 1

## 2017-01-21 MED ORDER — OXYCODONE-ACETAMINOPHEN 5-325 MG PO TABS
1.0000 | ORAL_TABLET | Freq: Once | ORAL | Status: AC
  Administered 2017-01-21: 1 via ORAL
  Filled 2017-01-21: qty 1

## 2017-01-21 NOTE — ED Triage Notes (Signed)
States she slipped and fell on the snow Saturday and now has lower back pain

## 2017-01-21 NOTE — ED Notes (Signed)
See triage note   Slipped on ice   Having lower back pain

## 2017-01-21 NOTE — ED Provider Notes (Signed)
[facility] Emergency Department Provider Note   ____________________________________________   First MD Initiated Contact with Patient 01/21/17 1413     (approximate)  I have reviewed the triage vital signs and the nursing notes.   HISTORY  Chief Complaint Back Pain    HPI Kristen Villegas is a 41 y.o. female patient complaining of low back pain secondary to a slip and fall 2 days ago. Patient stated pain is increased since the fall. Patient states she denies trouble getting in and out of bed or with standing up from a sitting position. Patient denies any radicular component to her back pain. Patient denies any bladder or bowel dysfunction. Patient rates the pain as a 10 over 10. Patient described a pain as "achy". No palliative measures taken for this complaint.  Past Medical History:  Diagnosis Date  . Collapsed lung   . Diabetes mellitus without complication (HCC)   . HIV disease (HCC)   . Seizures (HCC)     There are no active problems to display for this patient.   Past Surgical History:  Procedure Laterality Date  . LUNG SURGERY      Prior to Admission medications   Medication Sig Start Date End Date Taking? Authorizing Provider  acetaminophen (TYLENOL) 325 MG tablet Take 650 mg by mouth. 01/02/14   Historical Provider, MD  albuterol (PROVENTIL HFA;VENTOLIN HFA) 108 (90 Base) MCG/ACT inhaler Inhale into the lungs. 04/21/13   Historical Provider, MD  benztropine (COGENTIN) 1 MG tablet Take 1 mg by mouth.    Historical Provider, MD  buPROPion (WELLBUTRIN XL) 300 MG 24 hr tablet Take 300 mg by mouth.    Historical Provider, MD  cephALEXin (KEFLEX) 500 MG capsule Take 1 capsule (500 mg total) by mouth 2 (two) times daily. Patient not taking: Reported on 01/01/2017 08/25/15   Loleta Rose, MD  cetirizine (ZYRTEC) 10 MG tablet Take 10 mg by mouth.    Historical Provider, MD  clindamycin (CLEOCIN) 150 MG capsule Take 1 capsule (150 mg total) by mouth  4 (four) times daily. Patient not taking: Reported on 01/01/2017 07/09/15   Joni Reining, PA-C  cyclobenzaprine (FLEXERIL) 10 MG tablet Take 1 tablet (10 mg total) by mouth 3 (three) times daily as needed. 01/21/17   Joni Reining, PA-C  docusate sodium (STOOL SOFTENER) 100 MG capsule Take 100 mg by mouth. 01/02/14   Historical Provider, MD  elvitegravir-cobicistat-emtricitabine-tenofovir (STRIBILD) 150-150-200-300 MG TABS tablet Take by mouth. 03/26/16   Historical Provider, MD  hydrocortisone 2.5 % cream Apply topically.    Historical Provider, MD  hydrOXYzine (ATARAX/VISTARIL) 25 MG tablet Take 25 mg by mouth. 04/22/15   Historical Provider, MD  ibuprofen (ADVIL,MOTRIN) 800 MG tablet Take 1 tablet (800 mg total) by mouth every 8 (eight) hours as needed for moderate pain. 07/09/15   Joni Reining, PA-C  levETIRAcetam (KEPPRA) 1000 MG tablet Take 1,000 mg by mouth. 03/27/16 03/27/17  Historical Provider, MD  LORazepam (ATIVAN) 0.5 MG tablet Take 1 mg by mouth.    Historical Provider, MD  medroxyPROGESTERone (DEPO-PROVERA) 150 MG/ML injection Inject 150 mg into the muscle. 07/27/15   Historical Provider, MD  nicotine (NICODERM CQ) 14 mg/24hr patch Place onto the skin. 01/03/16   Historical Provider, MD  nicotine (NICODERM CQ) 21 mg/24hr patch Place onto the skin. 01/03/16   Historical Provider, MD  nicotine (NICODERM CQ) 7 mg/24hr patch Place onto the skin. 01/03/16   Historical Provider, MD  omeprazole (PRILOSEC) 40 MG capsule  Take 40 mg by mouth.    Historical Provider, MD  oxyCODONE-acetaminophen (PERCOCET) 7.5-325 MG per tablet Take 1 tablet by mouth every 6 (six) hours as needed for severe pain. Patient not taking: Reported on 01/01/2017 07/09/15   Joni Reining, PA-C  oxyCODONE-acetaminophen (ROXICET) 5-325 MG tablet Take 1 tablet by mouth every 6 (six) hours as needed for moderate pain. 01/21/17   Joni Reining, PA-C  polyethylene glycol [facility] Ethelene Hal) packet Take by mouth.    Historical Provider, MD    promethazine (PHENERGAN) 12.5 MG tablet Take 1 tablet (12.5 mg total) by mouth every 6 (six) hours as needed for nausea or vomiting. Patient not taking: Reported on 01/01/2017 11/21/16   Jeanmarie Plant, MD  risperiDONE (RISPERDAL) 2 MG tablet Take 2 mg by mouth.    Historical Provider, MD  varenicline (CHANTIX) 1 MG tablet Take 1 mg by mouth.    Historical Provider, MD    Allergies Penicillins  History reviewed. No pertinent family history.  Social History Social History  Substance Use Topics  . Smoking status: Current Every Day Smoker    Packs/day: 0.20    Types: Cigarettes  . Smokeless tobacco: Never Used  . Alcohol use No    Review of Systems Constitutional: No fever/chills Eyes: No visual changes. ENT: No sore throat. Cardiovascular: Denies chest pain. Respiratory: Denies shortness of breath. Gastrointestinal: No abdominal pain.  No nausea, no vomiting.  No diarrhea.  No constipation. Genitourinary: Negative for dysuria. Musculoskeletal: Negative for back pain. Skin: Negative for rash. Neurological: Negative for headaches, focal weakness or numbness.Seizures Endocrine:Diabetes Hematological/Lymphatic:HIV Allergic/Immunilogical: Penicillin ____________________________________________   PHYSICAL EXAM:  VITAL SIGNS: ED Triage Vitals  Enc Vitals Group     BP 01/21/17 1402 91/61     Pulse Rate 01/21/17 1402 92     Resp 01/21/17 1402 18     Temp 01/21/17 1402 98 F (36.7 C)     Temp Source 01/21/17 1402 Oral     SpO2 01/21/17 1402 95 %     Weight 01/21/17 1401 200 lb (90.7 kg)     Height 01/21/17 1401 5\' 5"  (1.651 m)     Head Circumference --      Peak Flow --      Pain Score 01/21/17 1401 10     Pain Loc --      Pain Edu? --      Excl. in GC? --     Constitutional: Alert and oriented. Well appearing and in no acute distress. Eyes: Conjunctivae are normal. PERRL. EOMI. Head: Atraumatic. Nose: No congestion/rhinnorhea. Mouth/Throat: Mucous membranes are  moist.  Oropharynx non-erythematous. Neck: No stridor.  No cervical spine tenderness to palpation. Hematological/Lymphatic/Immunilogical: No cervical lymphadenopathy. Cardiovascular: Normal rate, regular rhythm. Grossly normal heart sounds.  Good peripheral circulation. Respiratory: Normal respiratory effort.  No retractions. Lungs CTAB. Gastrointestinal: Soft and nontender. No distention. No abdominal bruits. No CVA tenderness. Musculoskeletal: No lower extremity tenderness nor edema.  No joint effusions. Neurologic:  Normal speech and language. No gross focal neurologic deficits are appreciated. No gait instability. Skin:  Skin is warm, dry and intact. No rash noted. Psychiatric: Mood and affect are normal. Speech and behavior are normal.  ____________________________________________   LABS (all labs ordered are listed, but only abnormal results are displayed)  Labs Reviewed  POC URINE PREG, ED  POCT PREGNANCY, URINE   ____________________________________________  EKG   ____________________________________________  RADIOLOGY  Acute findings x-ray of the lumbar spine. ____________________________________________   PROCEDURES  Procedure(s) performed: None  Procedures  Critical Care performed: No  ____________________________________________   INITIAL IMPRESSION / ASSESSMENT AND PLAN / ED COURSE  Pertinent labs & imaging results that were available during my care of the patient were reviewed by me and considered in my medical decision making (see chart for details).  Low back pain secondary to fall. Patient given discharge Instruction. Patient given prescription for Flexeril and Percocets. Patient advised follow-up with family clinic if condition persists.      ____________________________________________   FINAL CLINICAL IMPRESSION(S) / ED DIAGNOSES  Final diagnoses:  Acute midline low back pain without sciatica      NEW MEDICATIONS STARTED DURING THIS  VISIT:  New Prescriptions   CYCLOBENZAPRINE (FLEXERIL) 10 MG TABLET    Take 1 tablet (10 mg total) by mouth 3 (three) times daily as needed.   OXYCODONE-ACETAMINOPHEN (ROXICET) 5-325 MG TABLET    Take 1 tablet by mouth every 6 (six) hours as needed for moderate pain.     Note:  This document was prepared using Dragon voice recognition software and may include unintentional dictation errors.    Joni Reiningonald K Smith, PA-C 01/21/17 1545    Rockne MenghiniAnne-Caroline Norman, MD 01/21/17 [phone]

## 2017-01-22 ENCOUNTER — Inpatient Hospital Stay (HOSPITAL_COMMUNITY)
Admission: RE | Admit: 2017-01-22 | Discharge: 2017-01-22 | Disposition: A | Discharge status: Home / Self Care | Payer: Medicare Other | Source: Ambulatory Visit

## 2017-01-22 NOTE — Progress Notes (Signed)
While speaking with Kristen Villegas, pt.'s caregiver at the facility where she lives, he reports that the pt. Has a condition that effects her memory, but would not reveal what the condition is.  He was told that someone will call him to try & reschedule the pt.

## 2017-01-23 ENCOUNTER — Encounter (HOSPITAL_COMMUNITY): Payer: Self-pay | Admitting: *Deleted

## 2017-01-23 NOTE — Progress Notes (Signed)
Pt SDW-Pre-op Call completed by pt nurse, Burna MortimerWanda. Burna MortimerWanda denies that pt C/O of SOB and chest pain. Burna MortimerWanda denies that pt is under the care of a cardiologist. Burna MortimerWanda denies that pt had a stress test and cardiac cath but stated that an echo was performed at [facility] by Franco Nonesheryl Lindley, NP; records requested. Burna MortimerWanda not sure if an EKG was done within the last year but stated that labs were drawn at [facility] as well. Burna MortimerWanda unable to complete pt anesthesia assessment and stated that pt has memory difficulties. Burna MortimerWanda made aware to stop administering  Aspirin, vitamins, fish oil, and herbal medications. Do not take any NSAIDs ie: Ibuprofen, Advil, Naproxen, BC and Goody Powder or any medication containing Aspirin to pt. Anesthesia asked to review CT of chest (F/U from abnormal chest x ray). Burna MortimerWanda to fax current MAR. Burna MortimerWanda verbalized understanding of all pre-op instructions.

## 2017-01-24 NOTE — Progress Notes (Signed)
Dr. Michelle Piperssey, Anesthesia, reviewed pt chest CT with contrast dated 11/21/16. MD advised that CT was "okay," no new orders given.

## 2017-01-24 NOTE — Progress Notes (Signed)
Late entry. Pt nurse, Burna MortimerWanda, made aware of diabetes protocol. Burna MortimerWanda made aware to check pt blood glucose (BG ) every 2 hours prior to arrival to the hospital on DOS. Burna MortimerWanda stated that pt takes Novolog Insulin with set number of units at breakfast and dinner, no bedtime dose or sliding scale. Burna MortimerWanda made aware to not administer Novolog Insulin the morning of surgery and interventions for a BG <70 ( administer 4 glucose tabs, or glucose gel, or 4 ounces of Apple or Cranberry Juice and recheck BG 15 minutes after intervention). Burna MortimerWanda made aware to call the short stay unit if BG is not >70 after intervention and if BG is >220 since pt does not have a sliding scale. Burn[facility] MortimerWanda [facility] and verbalized understanding of all pre-op instructions.

## 2017-01-25 ENCOUNTER — Ambulatory Visit (HOSPITAL_COMMUNITY): Payer: Medicare Other

## 2017-01-25 ENCOUNTER — Encounter (HOSPITAL_COMMUNITY)
Admission: RE | Disposition: A | Discharge status: Home / Self Care | Payer: Self-pay | Source: Ambulatory Visit | Attending: General Surgery

## 2017-01-25 ENCOUNTER — Encounter (HOSPITAL_COMMUNITY): Payer: Self-pay | Admitting: *Deleted

## 2017-01-25 ENCOUNTER — Ambulatory Visit (HOSPITAL_COMMUNITY): Payer: Medicare Other | Admitting: Anesthesiology

## 2017-01-25 ENCOUNTER — Ambulatory Visit (HOSPITAL_COMMUNITY)
Admission: RE | Admit: 2017-01-25 | Discharge: 2017-01-25 | Disposition: A | Discharge status: Home / Self Care | Payer: Medicare Other | Source: Ambulatory Visit | Attending: General Surgery | Admitting: General Surgery

## 2017-01-25 DIAGNOSIS — K219 Gastro-esophageal reflux disease without esophagitis: Secondary | ICD-10-CM | POA: Diagnosis not present

## 2017-01-25 DIAGNOSIS — G8929 Other chronic pain: Secondary | ICD-10-CM | POA: Insufficient documentation

## 2017-01-25 DIAGNOSIS — Z794 Long term (current) use of insulin: Secondary | ICD-10-CM | POA: Insufficient documentation

## 2017-01-25 DIAGNOSIS — Z880 Allergy status to penicillin: Secondary | ICD-10-CM | POA: Diagnosis not present

## 2017-01-25 DIAGNOSIS — F17210 Nicotine dependence, cigarettes, uncomplicated: Secondary | ICD-10-CM | POA: Diagnosis not present

## 2017-01-25 DIAGNOSIS — E119 Type 2 diabetes mellitus without complications: Secondary | ICD-10-CM | POA: Diagnosis not present

## 2017-01-25 DIAGNOSIS — J449 Chronic obstructive pulmonary disease, unspecified: Secondary | ICD-10-CM | POA: Insufficient documentation

## 2017-01-25 DIAGNOSIS — E039 Hypothyroidism, unspecified: Secondary | ICD-10-CM | POA: Insufficient documentation

## 2017-01-25 DIAGNOSIS — M549 Dorsalgia, unspecified: Secondary | ICD-10-CM | POA: Insufficient documentation

## 2017-01-25 DIAGNOSIS — Z9889 Other specified postprocedural states: Secondary | ICD-10-CM | POA: Diagnosis not present

## 2017-01-25 DIAGNOSIS — Z79899 Other long term (current) drug therapy: Secondary | ICD-10-CM | POA: Insufficient documentation

## 2017-01-25 DIAGNOSIS — Z7952 Long term (current) use of systemic steroids: Secondary | ICD-10-CM | POA: Diagnosis not present

## 2017-01-25 DIAGNOSIS — Z87440 Personal history of urinary (tract) infections: Secondary | ICD-10-CM | POA: Insufficient documentation

## 2017-01-25 DIAGNOSIS — K8010 Calculus of gallbladder with chronic cholecystitis without obstruction: Secondary | ICD-10-CM | POA: Diagnosis not present

## 2017-01-25 DIAGNOSIS — E7800 Pure hypercholesterolemia, unspecified: Secondary | ICD-10-CM | POA: Insufficient documentation

## 2017-01-25 DIAGNOSIS — K8020 Calculus of gallbladder without cholecystitis without obstruction: Secondary | ICD-10-CM | POA: Diagnosis present

## 2017-01-25 DIAGNOSIS — B20 Human immunodeficiency virus [HIV] disease: Secondary | ICD-10-CM | POA: Diagnosis not present

## 2017-01-25 DIAGNOSIS — Z419 Encounter for procedure for purposes other than remedying health state, unspecified: Secondary | ICD-10-CM

## 2017-01-25 HISTORY — DX: High grade squamous intraepithelial lesion on cytologic smear of cervix (HGSIL): R87.613

## 2017-01-25 HISTORY — DX: Hypothyroidism, unspecified: E03.9

## 2017-01-25 HISTORY — DX: Headache, unspecified: R51.9

## 2017-01-25 HISTORY — DX: Other chronic pain: G89.29

## 2017-01-25 HISTORY — DX: Dorsalgia, unspecified: M54.9

## 2017-01-25 HISTORY — DX: Calculus of gallbladder without cholecystitis without obstruction: K80.20

## 2017-01-25 HISTORY — DX: Pure hypercholesterolemia, unspecified: E78.00

## 2017-01-25 HISTORY — DX: Progressive multifocal leukoencephalopathy: A81.2

## 2017-01-25 HISTORY — DX: Other amnesia: R41.3

## 2017-01-25 HISTORY — PX: CHOLECYSTECTOMY: SHX55

## 2017-01-25 HISTORY — DX: Chronic obstructive pulmonary disease, unspecified: J44.9

## 2017-01-25 HISTORY — DX: Gastro-esophageal reflux disease without esophagitis: K21.9

## 2017-01-25 HISTORY — DX: Headache: R51

## 2017-01-25 HISTORY — DX: Urinary tract infection, site not specified: N39.0

## 2017-01-25 LAB — COMPREHENSIVE METABOLIC PANEL
ALBUMIN: 3.3 g/dL — AB (ref 3.5–5.0)
ALT: 16 U/L (ref 14–54)
AST: 23 U/L (ref 15–41)
Alkaline Phosphatase: 65 U/L (ref 38–126)
Anion gap: 5 (ref 5–15)
BUN: 11 mg/dL (ref 6–20)
CHLORIDE: 109 mmol/L (ref 101–111)
CO2: 25 mmol/L (ref 22–32)
CREATININE: 0.92 mg/dL (ref 0.44–1.00)
Calcium: 8.9 mg/dL (ref 8.9–10.3)
GFR calc Af Amer: >60 mL/min (ref >60)
GFR calc non Af Amer: >60 mL/min (ref >60)
GLUCOSE: 87 mg/dL (ref 65–99)
Potassium: 4.3 mmol/L (ref 3.5–5.1)
SODIUM: 139 mmol/L (ref 135–145)
Total Bilirubin: 0.2 mg/dL — ABNORMAL LOW (ref 0.3–1.2)
Total Protein: 7.7 g/dL (ref 6.5–8.1)

## 2017-01-25 LAB — CBC
HCT: 34.4 % — ABNORMAL LOW (ref 36.0–46.0)
Hemoglobin: 11.6 g/dL — ABNORMAL LOW (ref 12.0–15.0)
MCH: 30.3 pg (ref 26.0–34.0)
MCHC: 33.7 g/dL (ref 30.0–36.0)
MCV: 89.8 fL (ref 78.0–100.0)
PLATELETS: 314 10*3/uL (ref 150–400)
RBC: 3.83 MIL/uL — AB (ref 3.87–5.11)
RDW: 13.5 % (ref 11.5–15.5)
WBC: 8.2 10*3/uL (ref 4.0–10.5)

## 2017-01-25 LAB — GLUCOSE, CAPILLARY
GLUCOSE-CAPILLARY: 95 mg/dL (ref 65–99)
Glucose-Capillary: 93 mg/dL (ref 65–99)

## 2017-01-25 LAB — HCG, SERUM, QUALITATIVE: Preg, Serum: NEGATIVE

## 2017-01-25 SURGERY — LAPAROSCOPIC CHOLECYSTECTOMY WITH INTRAOPERATIVE CHOLANGIOGRAM
Anesthesia: General | Site: Abdomen

## 2017-01-25 MED ORDER — CHLORHEXIDINE GLUCONATE CLOTH 2 % EX PADS: 6.0000 | MEDICATED_PAD | Freq: Once | CUTANEOUS | Status: DC

## 2017-01-25 MED ORDER — ROCURONIUM BROMIDE 50 MG/5ML IV SOSY
PREFILLED_SYRINGE | INTRAVENOUS | Status: AC
  Filled 2017-01-25: qty 5

## 2017-01-25 MED ORDER — 0.9 % SODIUM CHLORIDE (POUR BTL) OPTIME
TOPICAL | Status: DC | PRN
  Administered 2017-01-25: 1000 mL

## 2017-01-25 MED ORDER — BUPIVACAINE HCL (PF) 0.25 % IJ SOLN
INTRAMUSCULAR | Status: AC
  Filled 2017-01-25: qty 30

## 2017-01-25 MED ORDER — FENTANYL CITRATE (PF) 100 MCG/2ML IJ SOLN
INTRAMUSCULAR | Status: AC
  Filled 2017-01-25: qty 2

## 2017-01-25 MED ORDER — FENTANYL CITRATE (PF) 100 MCG/2ML IJ SOLN
INTRAMUSCULAR | Status: DC | PRN
  Administered 2017-01-25 (×2): 50 ug via INTRAVENOUS

## 2017-01-25 MED ORDER — ROCURONIUM BROMIDE 100 MG/10ML IV SOLN
INTRAVENOUS | Status: DC | PRN
  Administered 2017-01-25: 50 mg via INTRAVENOUS
  Administered 2017-01-25: 20 mg via INTRAVENOUS

## 2017-01-25 MED ORDER — FENTANYL CITRATE (PF) 100 MCG/2ML IJ SOLN: 25.0000 ug | INTRAMUSCULAR | Status: DC | PRN

## 2017-01-25 MED ORDER — SUGAMMADEX SODIUM 500 MG/5ML IV SOLN
INTRAVENOUS | Status: AC
  Filled 2017-01-25: qty 5

## 2017-01-25 MED ORDER — ONDANSETRON HCL 4 MG/2ML IJ SOLN
INTRAMUSCULAR | Status: DC | PRN
  Administered 2017-01-25: 4 mg via INTRAVENOUS

## 2017-01-25 MED ORDER — SODIUM CHLORIDE 0.9 % IV SOLN
INTRAVENOUS | Status: DC | PRN
  Administered 2017-01-25: 8 mL

## 2017-01-25 MED ORDER — MEPERIDINE HCL 25 MG/ML IJ SOLN: 6.2500 mg | INTRAMUSCULAR | Status: DC | PRN

## 2017-01-25 MED ORDER — ONDANSETRON HCL 4 MG/2ML IJ SOLN
INTRAMUSCULAR | Status: AC
  Filled 2017-01-25: qty 2

## 2017-01-25 MED ORDER — IOPAMIDOL (ISOVUE-300) INJECTION 61%
INTRAVENOUS | Status: AC
  Filled 2017-01-25: qty 50

## 2017-01-25 MED ORDER — SUGAMMADEX SODIUM 200 MG/2ML IV SOLN
INTRAVENOUS | Status: DC | PRN
  Administered 2017-01-25: 500 mg via INTRAVENOUS

## 2017-01-25 MED ORDER — LACTATED RINGERS IV SOLN: INTRAVENOUS | Status: DC

## 2017-01-25 MED ORDER — PROPOFOL 10 MG/ML IV BOLUS
INTRAVENOUS | Status: DC | PRN
  Administered 2017-01-25: 170 mg via INTRAVENOUS

## 2017-01-25 MED ORDER — PROPOFOL 10 MG/ML IV BOLUS
INTRAVENOUS | Status: AC
  Filled 2017-01-25: qty 20

## 2017-01-25 MED ORDER — LIDOCAINE 2% (20 MG/ML) 5 ML SYRINGE
INTRAMUSCULAR | Status: AC
  Filled 2017-01-25: qty 5

## 2017-01-25 MED ORDER — BUPIVACAINE HCL (PF) 0.25 % IJ SOLN
INTRAMUSCULAR | Status: DC | PRN
  Administered 2017-01-25: 17 mL

## 2017-01-25 MED ORDER — OXYCODONE-ACETAMINOPHEN 7.5-325 MG PO TABS: 1.0000 | ORAL_TABLET | ORAL | 0 refills | Status: DC | PRN

## 2017-01-25 MED ORDER — MIDAZOLAM HCL 2 MG/2ML IJ SOLN
INTRAMUSCULAR | Status: AC
  Filled 2017-01-25: qty 2

## 2017-01-25 MED ORDER — CIPROFLOXACIN IN D5W 400 MG/200ML IV SOLN
400.0000 mg | INTRAVENOUS | Status: AC
  Administered 2017-01-25: 400 mg via INTRAVENOUS
  Filled 2017-01-25: qty 200

## 2017-01-25 MED ORDER — SODIUM CHLORIDE 0.9 % IR SOLN
Status: DC | PRN
  Administered 2017-01-25: 1000 mL

## 2017-01-25 MED ORDER — LIDOCAINE HCL (CARDIAC) 20 MG/ML IV SOLN
INTRAVENOUS | Status: DC | PRN
  Administered 2017-01-25: 100 mg via INTRAVENOUS

## 2017-01-25 MED ORDER — METOCLOPRAMIDE HCL 5 MG/ML IJ SOLN: 10.0000 mg | Freq: Once | INTRAMUSCULAR | Status: DC | PRN

## 2017-01-25 MED ORDER — LACTATED RINGERS IV SOLN
INTRAVENOUS | Status: DC
  Administered 2017-01-25: 10:00:00 via INTRAVENOUS

## 2017-01-25 SURGICAL SUPPLY — 42 items
APPLIER CLIP 5 13 M/L LIGAMAX5 (MISCELLANEOUS) ×3
BLADE SURG ROTATE 9660 (MISCELLANEOUS) IMPLANT
CANISTER SUCTION 2500CC (MISCELLANEOUS) ×3 IMPLANT
CHLORAPREP W/TINT 26ML (MISCELLANEOUS) ×3 IMPLANT
CLIP APPLIE 5 13 M/L LIGAMAX5 (MISCELLANEOUS) ×1 IMPLANT
COVER MAYO STAND STRL (DRAPES) ×3 IMPLANT
COVER SURGICAL LIGHT HANDLE (MISCELLANEOUS) ×3 IMPLANT
DERMABOND ADVANCED (GAUZE/BANDAGES/DRESSINGS) ×2
DERMABOND ADVANCED .7 DNX12 (GAUZE/BANDAGES/DRESSINGS) ×1 IMPLANT
DRAPE C-ARM 42X72 X-RAY (DRAPES) ×3 IMPLANT
ELECT REM PT RETURN 9FT ADLT (ELECTROSURGICAL) ×3
ELECTRODE REM PT RTRN 9FT ADLT (ELECTROSURGICAL) ×1 IMPLANT
FILTER SMOKE EVAC LAPAROSHD (FILTER) IMPLANT
GLOVE BIO SURGEON STRL SZ8 (GLOVE) ×3 IMPLANT
GLOVE BIOGEL PI IND STRL 8 (GLOVE) ×1 IMPLANT
GLOVE BIOGEL PI INDICATOR 8 (GLOVE) ×2
GOWN STRL REUS W/ TWL LRG LVL3 (GOWN DISPOSABLE) ×2 IMPLANT
GOWN STRL REUS W/ TWL XL LVL3 (GOWN DISPOSABLE) ×1 IMPLANT
GOWN STRL REUS W/TWL LRG LVL3 (GOWN DISPOSABLE) ×4
GOWN STRL REUS W/TWL XL LVL3 (GOWN DISPOSABLE) ×2
KIT BASIN OR (CUSTOM PROCEDURE TRAY) ×3 IMPLANT
KIT ROOM TURNOVER OR (KITS) ×3 IMPLANT
L-HOOK LAP DISP 36CM (ELECTROSURGICAL) ×3
LHOOK LAP DISP 36CM (ELECTROSURGICAL) ×1 IMPLANT
NEEDLE 22X1 1/2 (OR ONLY) (NEEDLE) ×3 IMPLANT
NS IRRIG 1000ML POUR BTL (IV SOLUTION) ×3 IMPLANT
PAD ARMBOARD 7.5X6 YLW CONV (MISCELLANEOUS) ×3 IMPLANT
PENCIL BUTTON HOLSTER BLD 10FT (ELECTRODE) ×3 IMPLANT
POUCH RETRIEVAL ECOSAC 10 (ENDOMECHANICALS) ×1 IMPLANT
POUCH RETRIEVAL ECOSAC 10MM (ENDOMECHANICALS) ×2
SCISSORS LAP 5X35 DISP (ENDOMECHANICALS) ×3 IMPLANT
SET CHOLANGIOGRAPH 5 50 .035 (SET/KITS/TRAYS/PACK) ×3 IMPLANT
SET IRRIG TUBING LAPAROSCOPIC (IRRIGATION / IRRIGATOR) ×3 IMPLANT
SLEEVE ENDOPATH XCEL 5M (ENDOMECHANICALS) ×6 IMPLANT
SPECIMEN JAR SMALL (MISCELLANEOUS) ×3 IMPLANT
SUT VIC AB 4-0 PS2 27 (SUTURE) ×3 IMPLANT
TOWEL OR 17X24 6PK STRL BLUE (TOWEL DISPOSABLE) ×3 IMPLANT
TOWEL OR 17X26 10 PK STRL BLUE (TOWEL DISPOSABLE) ×3 IMPLANT
TRAY LAPAROSCOPIC MC (CUSTOM PROCEDURE TRAY) ×3 IMPLANT
TROCAR XCEL BLUNT TIP 100MML (ENDOMECHANICALS) ×3 IMPLANT
TROCAR XCEL NON-BLD 5MMX100MML (ENDOMECHANICALS) ×3 IMPLANT
TUBING INSUFFLATION (TUBING) ×3 IMPLANT

## 2017-01-25 NOTE — Interval H&P Note (Signed)
History and Physical Interval Note:  01/25/2017 9:59 AM  Kristen CornfieldJennifer R Iott  has presented today for surgery, with the diagnosis of SYMPTOMATIC CHOLELITHIASIS  The various methods of treatment have been discussed with the patient and family. After consideration of risks, benefits and other options for treatment, the patient has consented to  Procedure(s): LAPAROSCOPIC CHOLECYSTECTOMY WITH INTRAOPERATIVE CHOLANGIOGRAM (N/A) as a surgical intervention .  The patient's history has been reviewed, patient examined, no change in status, stable for surgery.  I have reviewed the patient's chart and labs.  Questions were answered to the patient's satisfaction.     [name]

## 2017-01-25 NOTE — Anesthesia Postprocedure Evaluation (Signed)
Anesthesia Post Note  Patient: Judeth CornfieldJennifer R Artley  Procedure(s) Performed: Procedure(s) (LRB): LAPAROSCOPIC CHOLECYSTECTOMY WITH INTRAOPERATIVE CHOLANGIOGRAM (N/A)  Patient location during evaluation: PACU Anesthesia Type: General Level of consciousness: awake and alert Pain management: pain level controlled Vital Signs Assessment: post-procedure vital signs reviewed and stable Respiratory status: spontaneous breathing, nonlabored ventilation, respiratory function stable and patient connected to nasal cannula oxygen Cardiovascular status: blood pressure returned to baseline and stable Postop Assessment: no signs of nausea or vomiting Anesthetic complications: no       Last Vitals:  Vitals:   01/25/17 1207 01/25/17 1230  BP: 109/68 135/81  Pulse: 82 96  Resp: (!) 26 18  Temp:      Last Pain:  Vitals:   01/25/17 1230  TempSrc:   PainSc: 0-No pain                 Phillips Groutarignan, Bud Kaeser

## 2017-01-25 NOTE — Anesthesia Procedure Notes (Signed)
Procedure Name: Intubation Date/Time: 01/25/2017 10:25 AM Performed by: Kyung Rudd Pre-anesthesia Checklist: Patient identified, Emergency Drugs available, Suction available and Patient being monitored Patient Re-evaluated:Patient Re-evaluated prior to inductionOxygen Delivery Method: Circle system utilized Preoxygenation: Pre-oxygenation with 100% oxygen Intubation Type: IV induction Ventilation: Mask ventilation without difficulty Laryngoscope Size: Mac and 3 Grade View: Grade I Tube type: Oral Tube size: 7.0 mm Number of attempts: 1 Airway Equipment and Method: Stylet Placement Confirmation: ETT inserted through vocal cords under direct vision,  positive ETCO2 and breath sounds checked- equal and bilateral Secured at: 20 cm Tube secured with: Tape Dental Injury: Teeth and Oropharynx as per pre-operative assessment

## 2017-01-25 NOTE — Op Note (Signed)
01/25/2017  11:15 AM  PATIENT:  Kristen Villegas  41 y.o. female  PRE-OPERATIVE DIAGNOSIS:  SYMPTOMATIC CHOLELITHIASIS  POST-OPERATIVE DIAGNOSIS:  SYMPTOMATIC CHOLELITHIASIS  PROCEDURE:  Procedure(s): LAPAROSCOPIC CHOLECYSTECTOMY WITH INTRAOPERATIVE CHOLANGIOGRAM  SURGEON:  Violeta GelinasBurke Laquita Harlan, M.D.   ASSISTANTS: Twana Firsthelsea Conner, M.D.   ANESTHESIA:   local and general  EBL:  No intake/output data recorded.  BLOOD ADMINISTERED:none  DRAINS: none   SPECIMEN:  Excision  DISPOSITION OF SPECIMEN:  PATHOLOGY  COUNTS:  YES  DICTATION: .Dragon Dictation Kristen DikeJennifer presents for cholecystectomy. She was identified in the preop holding area. Informed consent was obtained. She received intravenous antibiotics. She was brought to the operating room and general endotracheal anesthesia was administered by the anesthesia staff. Her abdomen was prepped and draped in sterile fashion. We did a time out procedure.The infraumbilical region was infiltrated with local. Infraumbilical incision was made. Subcutaneous tissues were dissected down revealing the anterior fascia. This was divided sharply along the midline. Peritoneal cavity was entered under direct vision without complication. A 0 Vicryl pursestring was placed around the fascial opening. Hassan trocar was inserted into the abdomen. The abdomen was insufflated with carbon dioxide in standard fashion. Under direct vision a 5 mm epigastric and 5 mm right port 2 were placed. Local was used at each port site. Dome the gallbladder was retracted superior medially. The infundibulum was retracted inferior laterally. Dissection began laterally and progressed medially. First, we identified the cystic artery. This was clipped twice proximally, once distally and divided. Next careful dissection revealed the cystic duct. We continued dissecting until a clear window was obtained between the cystic duct, the infundibulum, and the liver. Once we had excellent  visualization a clip was placed on the infundibular cystic duct junction. A small nick was made in the cystic duct and a cholangiogram catheter was inserted. Intraoperative cringed and was obtained demonstrating no common bile duct filling defects and good flow of contrast into the duodenum. The catheter was removed and 3 clips were placed proximally on the cystic duct and it was divided. The gallbladder was gradually dissected off the liver bed. We did encounter a small posterior branch of the cystic artery. This was clipped twice proximally and divided. The gallbladder was taken the rest of the way off the liver bed using cautery and achieving good hemostasis. The gallbladder was placed in a bag and removed from the abdomen. It was sent to pathology. Liver bed was cauterized to get good hemostasis. The area was copiously irrigated. Irrigation returned clear. Clips remain in good position. Liver bed was dry. Ports were removed under direct vision. Pneumoperitoneum was released. Informed local fascia was closed by tying the pursestring. All 4 wounds were copiously irrigated and the skin of each was closed with running 4 Vicryl subcuticular followed by Dermabond. All counts were correct. She tolerated the procedure without apparent complication was taken recovery in stable condition.  PATIENT DISPOSITION:  PACU - hemodynamically stable.   Delay start of Pharmacological VTE agent (>24hrs) due to surgical blood loss or risk of bleeding:  no  Violeta GelinasBurke Violeta Lecount, MD, MPH, FACS Pager: [phone]  1/26/201811:15 AM

## 2017-01-25 NOTE — H&P (Signed)
Kristen Villegas is an 41 y.o. female.   Chief Complaint: RUQ pain HPI: Kristen Villegas presents for lap chole/IOC for symptomatic cholelithiasis.  Past Medical History:  Diagnosis Date  . Cholelithiasis   . Chronic back pain   . Collapsed lung   . COPD (chronic obstructive pulmonary disease) ([place])   . Diabetes mellitus without complication ([place])   . GERD (gastroesophageal reflux disease)   . Headache   . 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 HIV disease ([place])   . Hypercholesterolemia   . Hypothyroidism   . Memory difficulties   . PML (progressive multifocal leukoencephalopathy)   . Recurrent UTI   . Seizures ([place])     Past Surgical History:  Procedure Laterality Date  . LUNG SURGERY      Family History  Problem Relation Age of Onset  . Family history unknown: Yes   Social History:  reports that she has been smoking Cigarettes.  She has been smoking about 0.20 packs per day. She has never used smokeless tobacco. She reports that she uses drugs, including Marijuana. She reports that she does not drink alcohol.  Allergies:  Allergies  Allergen Reactions  . Penicillins Nausea And Vomiting    Medications Prior to Admission  Medication Sig Dispense Refill  . acetaminophen (TYLENOL) 325 MG tablet Take 650 mg by mouth every 6 (six) hours as needed for moderate pain or headache.     . albuterol (PROVENTIL HFA;VENTOLIN HFA) 108 (90 Base) MCG/ACT inhaler Inhale 2 puffs into the lungs 4 (four) times daily as needed for wheezing or shortness of breath.     . benztropine (COGENTIN) 1 MG tablet Take 1 mg by mouth daily.     Marland Kitchen buPROPion (WELLBUTRIN XL) 300 MG 24 hr tablet Take 300 mg by mouth daily.     . cetirizine (ZYRTEC) 10 MG tablet Take 10 mg by mouth daily.     . cyclobenzaprine (FLEXERIL) 10 MG tablet Take 1 tablet (10 mg total) by mouth 3 (three) times daily as needed. (Patient taking differently: Take 10 mg by mouth 3 (three) times daily  as needed for muscle spasms. ) 15 tablet 0  . docusate sodium (STOOL SOFTENER) 100 MG capsule Take 100 mg by mouth 2 (two) times daily.     Marland Kitchen elvitegravir-cobicistat-emtricitabine-tenofovir (STRIBILD) 150-150-200-300 MG TABS tablet Take 1 tablet by mouth daily with breakfast.     . ezetimibe (ZETIA) 10 MG tablet Take 10 mg by mouth daily.    . hydrocortisone 2.5 % cream Apply 1 application topically daily as needed.     . hydrOXYzine (ATARAX/VISTARIL) 25 MG tablet Take 25-50 mg by mouth See admin instructions. Take 25 mg by mouth in the morning and take 50 mg by mouth at bedtime    . ibuprofen (ADVIL,MOTRIN) 800 MG tablet Take 1 tablet (800 mg total) by mouth every 8 (eight) hours as needed for moderate pain. (Patient taking differently: Take 600 mg by mouth every 6 (six) hours as needed for moderate pain. ) 15 tablet 0  . insulin NPH Human (HUMULIN N,NOVOLIN N) 100 UNIT/ML injection Inject 5-10 Units into the skin See admin instructions. Inject 10 units SQ in the morning and inject 5 units SQ before supper    . levETIRAcetam (KEPPRA) 500 MG tablet Take 1,000 mg by mouth 2 (two) times daily.     Marland Kitchen LORazepam (ATIVAN) 0.5 MG tablet Take 0.5 mg by mouth every 6 (six) hours as needed (for  agitation).     . miconazole (MICONAZOLE 7) 100 MG vaginal suppository Place 100 mg vaginally at bedtime as needed.    . Multiple Vitamins-Minerals (MULTIVITAMIN PO) Take 1 tablet by mouth daily.    Marland Kitchen omeprazole (PRILOSEC) 20 MG capsule Take 40 mg by mouth 2 (two) times daily.     . ondansetron (ZOFRAN) 4 MG tablet Take 4 mg by mouth 3 (three) times daily as needed for nausea or vomiting.    Marland Kitchen oxyCODONE-acetaminophen (ROXICET) 5-325 MG tablet Take 1 tablet by mouth every 6 (six) hours as needed for moderate pain. 12 tablet 0  . polyethylene glycol (MIRALAX / GLYCOLAX) packet Take 17 g by mouth daily as needed for moderate constipation.     . Probiotic Product (PROBIOTIC COLON SUPPORT PO) Take 1 capsule by mouth daily.     . risperiDONE (RISPERDAL) 2 MG tablet Take 2 mg by mouth 2 (two) times daily.     Marland Kitchen umeclidinium bromide (INCRUSE ELLIPTA) 62.5 MCG/INH AEPB Inhale 1 puff into the lungs daily.    . varenicline (CHANTIX) 1 MG tablet Take 1 mg by mouth 2 (two) times daily.     . cephALEXin (KEFLEX) 500 MG capsule Take 1 capsule (500 mg total) by mouth 2 (two) times daily. (Patient not taking: Reported on 01/01/2017) 14 capsule 0  . clindamycin (CLEOCIN) 150 MG capsule Take 1 capsule (150 mg total) by mouth 4 (four) times daily. (Patient not taking: Reported on 01/01/2017) 40 capsule 0  . medroxyPROGESTERone (DEPO-PROVERA) 150 MG/ML injection Inject 150 mg into the muscle every 3 (three) months.     Marland Kitchen oxyCODONE-acetaminophen (PERCOCET) 7.5-325 MG per tablet Take 1 tablet by mouth every 6 (six) hours as needed for severe pain. (Patient not taking: Reported on 01/01/2017) 12 tablet 0  . promethazine (PHENERGAN) 12.5 MG tablet Take 1 tablet (12.5 mg total) by mouth every 6 (six) hours as needed for nausea or vomiting. (Patient not taking: Reported on 01/01/2017) 10 tablet 0    Results for orders placed or performed during the hospital encounter of 01/25/17 (from the past 48 hour(s))  Comprehensive metabolic panel     Status: Abnormal   Collection Time: 01/25/17  9:10 AM  Result Value Ref Range   Sodium 139 135 - 145 mmol/L   Potassium 4.3 3.5 - 5.1 mmol/L   Chloride 109 101 - 111 mmol/L   CO2 25 22 - 32 mmol/L   Glucose, Bld 87 65 - 99 mg/dL   BUN 11 6 - 20 mg/dL   Creatinine, Ser 0.92 0.44 - 1.00 mg/dL   Calcium 8.9 8.9 - 10.3 mg/dL   Total Protein 7.7 6.5 - 8.1 g/dL   Albumin 3.3 (L) 3.5 - 5.0 g/dL   AST 23 15 - 41 U/L   ALT 16 14 - 54 U/L   Alkaline Phosphatase 65 38 - 126 U/L   Total Bilirubin 0.2 (L) 0.3 - 1.2 mg/dL   GFR calc non Af Amer >60 >60 mL/min   GFR calc Af Amer >60 >60 mL/min    Comment: (NOTE) The eGFR has been calculated using the CKD EPI equation. This calculation has not been validated in all  clinical situations. eGFR's persistently <60 mL/min signify possible Chronic Kidney Disease.    Anion gap 5 5 - 15  CBC     Status: Abnormal   Collection Time: 01/25/17  9:10 AM  Result Value Ref Range   WBC 8.2 4.0 - 10.5 K/uL   RBC 3.83 (L) 3.87 -  5.11 MIL/uL   Hemoglobin 11.6 (L) 12.0 - 15.0 g/dL   HCT 34.4 (L) 36.0 - 46.0 %   MCV 89.8 78.0 - 100.0 fL   MCH 30.3 26.0 - 34.0 pg   MCHC 33.7 30.0 - 36.0 g/dL   RDW 13.5 11.5 - 15.5 %   Platelets 314 150 - 400 K/uL  hCG, serum, qualitative     Status: None   Collection Time: 01/25/17  9:10 AM  Result Value Ref Range   Preg, Serum NEGATIVE NEGATIVE    Comment:        THE SENSITIVITY OF THIS METHODOLOGY IS >10 mIU/mL.   Glucose, capillary     Status: None   Collection Time: 01/25/17  9:14 AM  Result Value Ref Range   Glucose-Capillary 93 65 - 99 mg/dL   No results found.  ROS  Blood pressure 105/77, pulse 77, temperature 97.3 F (36.3 C), temperature source Oral, resp. rate 20, SpO2 100 %. Physical Exam  Constitutional: She appears well-developed and well-nourished.  HENT:  Head: Normocephalic.  Mouth/Throat: Oropharynx is clear and moist.  Eyes: EOM are normal. Pupils are equal, round, and reactive to light.  Neck: Neck supple.  Cardiovascular: Normal rate and normal heart sounds.   Respiratory: Effort normal and breath sounds normal.  GI: Soft. She exhibits no distension. There is no tenderness. There is no rebound.  Musculoskeletal: Normal range of motion.  Neurological: She is alert.  Skin: Skin is warm.     Assessment/Plan Symptomatic cholelithiasis - for laparoscopic cholecystectomy with intraoperative cholangiogram. Procedure, risks, and benefits were again discussed.  Zenovia Jarred, MD 01/25/2017, 9:56 AM

## 2017-01-25 NOTE — Anesthesia Preprocedure Evaluation (Addendum)
Anesthesia Evaluation  Patient identified by MRN, date of birth, ID band Patient awake    Reviewed: Allergy & Precautions, NPO status , Patient's Chart, lab work & pertinent test results  Airway Mallampati: II  TM Distance: >3 FB Neck ROM: Full    Dental  (+) Edentulous Upper, Edentulous Lower   Pulmonary COPD, Current Smoker,    Pulmonary exam normal breath sounds clear to auscultation       Cardiovascular negative cardio ROS Normal cardiovascular exam Rhythm:Regular Rate:Normal     Neuro/Psych Seizures -, Well Controlled,  progressive multifocal leukoencephalopathy negative psych ROS   GI/Hepatic negative GI ROS, Neg liver ROS,   Endo/Other  diabetes, Type 2  Renal/GU negative Renal ROS  negative genitourinary   Musculoskeletal negative musculoskeletal ROS (+)   Abdominal   Peds negative pediatric ROS (+)  Hematology negative hematology ROS (+)   Anesthesia Other Findings   Reproductive/Obstetrics negative OB ROS                           Anesthesia Physical Anesthesia Plan  ASA: III  Anesthesia Plan: General   Post-op Pain Management:    Induction: Intravenous  Airway Management Planned: Oral ETT  Additional Equipment:   Intra-op Plan:   Post-operative Plan: Extubation in OR  Informed Consent: I have reviewed the patients History and Physical, chart, labs and discussed the procedure including the risks, benefits and alternatives for the proposed anesthesia with the patient or authorized representative who has indicated his/her understanding and acceptance.   Dental advisory given  Plan Discussed with: Anesthesiologist  Anesthesia Plan Comments:         Anesthesia Quick Evaluation

## 2017-01-25 NOTE — Anesthesia Preprocedure Evaluation (Signed)
Anesthesia Evaluation  Patient identified by MRN, date of birth, ID band Patient awake    Reviewed: Allergy & Precautions, NPO status , Patient's Chart, lab work & pertinent test results  Airway Mallampati: II  TM Distance: >3 FB Neck ROM: Full    Dental no notable dental hx.    Pulmonary COPD, Current Smoker,    Pulmonary exam normal breath sounds clear to auscultation       Cardiovascular negative cardio ROS Normal cardiovascular exam Rhythm:Regular Rate:Normal     Neuro/Psych Seizures -, Well Controlled,  progressive multifocal leukoencephalopathy negative neurological ROS  negative psych ROS   GI/Hepatic negative GI ROS, Neg liver ROS,   Endo/Other  diabetes, Type 2, Insulin Dependent  Renal/GU negative Renal ROS  negative genitourinary   Musculoskeletal negative musculoskeletal ROS (+)   Abdominal   Peds negative pediatric ROS (+)  Hematology  (+) HIV,   Anesthesia Other Findings   Reproductive/Obstetrics negative OB ROS                            Anesthesia Physical Anesthesia Plan  ASA: III  Anesthesia Plan: General   Post-op Pain Management:    Induction: Intravenous  Airway Management Planned: Oral ETT  Additional Equipment:   Intra-op Plan:   Post-operative Plan: Extubation in OR  Informed Consent: I have reviewed the patients History and Physical, chart, labs and discussed the procedure including the risks, benefits and alternatives for the proposed anesthesia with the patient or authorized representative who has indicated his/her understanding and acceptance.   Dental advisory given  Plan Discussed with: CRNA  Anesthesia Plan Comments:         Anesthesia Quick Evaluation

## 2017-01-25 NOTE — Transfer of Care (Signed)
Immediate Anesthesia Transfer of Care Note  Patient: Kristen Villegas  Procedure(s) Performed: Procedure(s): LAPAROSCOPIC CHOLECYSTECTOMY WITH INTRAOPERATIVE CHOLANGIOGRAM (N/A)  Patient Location: PACU  Anesthesia Type:General  Level of Consciousness: awake, alert  and oriented  Airway & Oxygen Therapy: Patient Spontanous Breathing and Patient connected to face mask oxygen  Post-op Assessment: Report given to RN, Post -op Vital signs reviewed and stable and Patient moving all extremities X 4  Post vital signs: Reviewed and stable  Last Vitals:  Vitals:   01/25/17 0845 01/25/17 1122  BP: 105/77 110/78  Pulse: 77 84  Resp: 20   Temp: 36.3 C 36.6 C    Last Pain:  Vitals:   01/25/17 1122  TempSrc:   PainSc: Asleep      Patients Stated Pain Goal: 5 (01/25/17 0914)  Complications: No apparent anesthesia complications

## 2017-01-25 NOTE — Progress Notes (Signed)
D/C instructions reviewed with pt's driver , Onalee HuaDavid - verbalized an understanding = scripts with pt - will be given to Onalee Huaavid on arrival to unit

## 2017-01-25 NOTE — Progress Notes (Signed)
This patient's record was reviewed on the [place] Controlled Substances Database. Devanee Pomplun, MD, MPH, FACS Trauma: [phone] General Surgery: [phone]  

## 2017-01-26 ENCOUNTER — Encounter (HOSPITAL_COMMUNITY): Payer: Self-pay | Admitting: General Surgery

## 2017-01-26 LAB — HEMOGLOBIN A1C
Hgb A1c MFr Bld: 5.4 % (ref 4.8–5.6)
MEAN PLASMA GLUCOSE: 108 mg/dL

## 2017-04-15 ENCOUNTER — Ambulatory Visit (INDEPENDENT_AMBULATORY_CARE_PROVIDER_SITE_OTHER): Payer: Medicare Other | Admitting: Podiatry

## 2017-04-15 ENCOUNTER — Encounter: Payer: Self-pay | Admitting: Podiatry

## 2017-04-15 DIAGNOSIS — B351 Tinea unguium: Secondary | ICD-10-CM | POA: Diagnosis not present

## 2017-04-15 DIAGNOSIS — M79609 Pain in unspecified limb: Secondary | ICD-10-CM | POA: Diagnosis not present

## 2017-04-15 NOTE — Progress Notes (Signed)
Complaint:  Visit Type: Patient returns to my office for continued preventative foot care services. Complaint: Patient states" my nails have grown long and thick and become painful to walk and wear shoes" Patient has been diagnosed with DM with no foot complications. The patient presents for preventative foot care services. No changes to ROS  Podiatric Exam: Vascular: dorsalis pedis and posterior tibial pulses are palpable bilateral. Capillary return is immediate. Temperature gradient is WNL. Skin turgor WNL  Sensorium: Normal Semmes Weinstein monofilament test. Normal tactile sensation bilaterally. Nail Exam: Pt has thick disfigured discolored nails with subungual debris noted bilateral entire nail hallux through fifth toenails Ulcer Exam: There is no evidence of ulcer or pre-ulcerative changes or infection. Orthopedic Exam: Muscle tone and strength are WNL. No limitations in general ROM. No crepitus or effusions noted. Foot type and digits show no abnormalities. Bony prominences are unremarkable. Skin: No Porokeratosis. No infection or ulcers  Diagnosis:  Onychomycosis, , Pain in right toe, pain in left toes  Treatment & Plan Procedures and Treatment: Consent by patient was obtained for treatment procedures. The patient understood the discussion of treatment and procedures well. All questions were answered thoroughly reviewed. Debridement of mycotic and hypertrophic toenails, 1 through 5 bilateral and clearing of subungual debris. No ulceration, no infection noted.  Return Visit-Office Procedure: Patient instructed to return to the office for a follow up visit 3 months for continued evaluation and treatment.    Shriya Aker DPM 

## 2017-07-18 ENCOUNTER — Ambulatory Visit (INDEPENDENT_AMBULATORY_CARE_PROVIDER_SITE_OTHER): Payer: Medicare Other | Admitting: Podiatry

## 2017-07-18 ENCOUNTER — Encounter: Payer: Self-pay | Admitting: Podiatry

## 2017-07-18 DIAGNOSIS — B351 Tinea unguium: Secondary | ICD-10-CM

## 2017-07-18 DIAGNOSIS — M79609 Pain in unspecified limb: Secondary | ICD-10-CM

## 2017-07-18 NOTE — Progress Notes (Signed)
Complaint:  Visit Type: Patient returns to my office for continued preventative foot care services. Complaint: Patient states" my nails have grown long and thick and become painful to walk and wear shoes" Patient has been diagnosed with DM with no foot complications. The patient presents for preventative foot care services. No changes to ROS.  Left big toenail fell off.  No pain Podiatric Exam: Vascular: dorsalis pedis and posterior tibial pulses are palpable bilateral. Capillary return is immediate. Temperature gradient is WNL. Skin turgor WNL  Sensorium: Normal Semmes Weinstein monofilament test. Normal tactile sensation bilaterally. Nail Exam: Pt has thick disfigured discolored nails with subungual debris noted bilateral entire nail hallux through fifth toenails.  The left hallux toenail has self avulsed with no evidence of any redness, swelling or drainage Ulcer Exam: There is no evidence of ulcer or pre-ulcerative changes or infection. Orthopedic Exam: Muscle tone and strength are WNL. No limitations in general ROM. No crepitus or effusions noted. Foot type and digits show no abnormalities. Bony prominences are unremarkable. Skin: No Porokeratosis. No infection or ulcers  Diagnosis:  Onychomycosis, , Pain in right toe, pain in left toes  Treatment & Plan Procedures and Treatment: Consent by patient was obtained for treatment procedures. The patient understood the discussion of treatment and procedures well. All questions were answered thoroughly reviewed. Debridement of mycotic and hypertrophic toenails, 1 through 5 bilateral and clearing of subungual debris. No ulceration, no infection noted.  Return Visit-Office Procedure: Patient instructed to return to the office for a follow up visit 4 months for continued evaluation and treatment.    Helane GuntherGregory Granvil Djordjevic DPM

## 2017-10-17 ENCOUNTER — Ambulatory Visit (INDEPENDENT_AMBULATORY_CARE_PROVIDER_SITE_OTHER): Payer: Medicare Other | Admitting: Podiatry

## 2017-10-17 ENCOUNTER — Encounter: Payer: Self-pay | Admitting: Podiatry

## 2017-10-17 DIAGNOSIS — M79609 Pain in unspecified limb: Secondary | ICD-10-CM

## 2017-10-17 DIAGNOSIS — B351 Tinea unguium: Secondary | ICD-10-CM

## 2017-10-17 NOTE — Progress Notes (Signed)
Complaint:  Visit Type: Patient returns to my office for continued preventative foot care services. Complaint: Patient states" my nails have grown long and thick and become painful to walk and wear shoes" Patient has been diagnosed with DM with no foot complications. The patient presents for preventative foot care services. No changes to ROS.  Left big toenail fell off previously and been growing back well. Podiatric Exam: Vascular: dorsalis pedis and posterior tibial pulses are palpable bilateral. Capillary return is immediate. Temperature gradient is WNL. Skin turgor WNL  Sensorium: Normal Semmes Weinstein monofilament test. Normal tactile sensation bilaterally. Nail Exam: Pt has thick disfigured discolored nails with subungual debris noted bilateral entire nail hallux through fifth toenails.  The left hallux toenail has self avulsed with no evidence of any redness, swelling or drainage Ulcer Exam: There is no evidence of ulcer or pre-ulcerative changes or infection. Orthopedic Exam: Muscle tone and strength are WNL. No limitations in general ROM. No crepitus or effusions noted. Foot type and digits show no abnormalities. Bony prominences are unremarkable. Skin: No Porokeratosis. No infection or ulcers  Diagnosis:  Onychomycosis, , Pain in right toe, pain in left toes  Treatment & Plan Procedures and Treatment: Consent by patient was obtained for treatment procedures. The patient understood the discussion of treatment and procedures well. All questions were answered thoroughly reviewed. Debridement of mycotic and hypertrophic toenails, 1 through 5 bilateral and clearing of subungual debris. No ulceration, no infection noted.  Return Visit-Office Procedure: Patient instructed to return to the office for a follow up visit 4 months for continued evaluation and treatment.    Helane GuntherGregory Oneill Bais DPM

## 2018-01-14 ENCOUNTER — Emergency Department
Admission: EM | Admit: 2018-01-14 | Discharge: 2018-01-14 | Disposition: A | Discharge status: Home / Self Care | Payer: Medicare Other | Attending: Emergency Medicine | Admitting: Emergency Medicine

## 2018-01-14 ENCOUNTER — Emergency Department: Payer: Medicare Other

## 2018-01-14 ENCOUNTER — Other Ambulatory Visit: Payer: Self-pay

## 2018-01-14 DIAGNOSIS — Z794 Long term (current) use of insulin: Secondary | ICD-10-CM | POA: Diagnosis not present

## 2018-01-14 DIAGNOSIS — E039 Hypothyroidism, unspecified: Secondary | ICD-10-CM | POA: Diagnosis not present

## 2018-01-14 DIAGNOSIS — W19XXXA Unspecified fall, initial encounter: Secondary | ICD-10-CM | POA: Diagnosis not present

## 2018-01-14 DIAGNOSIS — B20 Human immunodeficiency virus [HIV] disease: Secondary | ICD-10-CM | POA: Diagnosis not present

## 2018-01-14 DIAGNOSIS — E119 Type 2 diabetes mellitus without complications: Secondary | ICD-10-CM | POA: Insufficient documentation

## 2018-01-14 DIAGNOSIS — M25551 Pain in right hip: Secondary | ICD-10-CM | POA: Diagnosis not present

## 2018-01-14 DIAGNOSIS — M25521 Pain in right elbow: Secondary | ICD-10-CM | POA: Diagnosis not present

## 2018-01-14 DIAGNOSIS — J449 Chronic obstructive pulmonary disease, unspecified: Secondary | ICD-10-CM | POA: Insufficient documentation

## 2018-01-14 DIAGNOSIS — Z79899 Other long term (current) drug therapy: Secondary | ICD-10-CM | POA: Insufficient documentation

## 2018-01-14 DIAGNOSIS — F17210 Nicotine dependence, cigarettes, uncomplicated: Secondary | ICD-10-CM | POA: Insufficient documentation

## 2018-01-14 MED ORDER — ACETAMINOPHEN 500 MG PO TABS
1000.0000 mg | ORAL_TABLET | Freq: Once | ORAL | Status: AC
  Administered 2018-01-14: 1000 mg via ORAL

## 2018-01-14 MED ORDER — ACETAMINOPHEN 500 MG PO TABS
ORAL_TABLET | ORAL | Status: AC
  Filled 2018-01-14: qty 2

## 2018-01-14 NOTE — ED Notes (Signed)
Pt ambulatory at discharge. Pt used this RN's phone to call group home and stated her ride was on the way. Pt would like to wait in lobby. Verbalized understanding of discharge instruction and pain management at home. VSS. Skin warm and dry. A&O x4.

## 2018-01-14 NOTE — ED Triage Notes (Signed)
Pt fell while sitting in the toilet getting dressed, states fell onto right side. Pt is having generalized right side body pain. States right arm, right hip, and right leg. Pt is ambulatory, no loc.

## 2018-01-15 NOTE — ED Provider Notes (Signed)
[facility] Emergency Department Provider Note  ____________________________________________  Time seen: Approximately 12:15 AM  I have reviewed the triage vital signs and the nursing notes.   HISTORY  Chief Complaint Fall    HPI Kristen Villegas is a 42 y.o. female presents to the emergency department with right elbow and right hip pain after falling earlier today.  Patient did not hit her head or lose consciousness.  Patient reports that she was attempting to get dressed while sitting on the toilet and fell over on her right side.  She denies weakness, radiculopathy or changes in sensation of the lower extremities.  No skin compromise.  Patient currently resides in a group home.   Past Medical History:  Diagnosis Date  . Cholelithiasis   . Chronic back pain   . Collapsed lung   . COPD (chronic obstructive pulmonary disease) (HCC)   . Diabetes mellitus without complication (HCC)   . GERD (gastroesophageal reflux disease)   . Headache   . 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 HIV disease (HCC)   . Hypercholesterolemia   . Hypothyroidism   . Memory difficulties   . PML (progressive multifocal leukoencephalopathy)   . Recurrent UTI   . Seizures (HCC)     There are no active problems to display for this patient.   Past Surgical History:  Procedure Laterality Date  . CHOLECYSTECTOMY N/A 01/25/2017   Procedure: LAPAROSCOPIC CHOLECYSTECTOMY WITH INTRAOPERATIVE CHOLANGIOGRAM;  Surgeon: Violeta Gelinas, MD;  Location: [facility] OR;  Service: General;  Laterality: N/A;  . LUNG SURGERY      Prior to Admission medications   Medication Sig Start Date End Date Taking? Authorizing Provider  acetaminophen (TYLENOL) 325 MG tablet Take 650 mg by mouth every 6 (six) hours as needed for moderate pain or headache.  01/02/14   [provider]  albuterol (PROVENTIL HFA;VENTOLIN HFA) 108 (90 Base) MCG/ACT inhaler Inhale 2  puffs into the lungs 4 (four) times daily as needed for wheezing or shortness of breath.  04/21/13   [provider]  benztropine (COGENTIN) 1 MG tablet Take 1 mg by mouth daily.     [provider]  buPROPion (WELLBUTRIN XL) 300 MG 24 hr tablet Take 300 mg by mouth daily.     [provider]  cetirizine (ZYRTEC) 10 MG tablet Take 10 mg by mouth daily.     [provider]  cyclobenzaprine (FLEXERIL) 10 MG tablet Take 1 tablet (10 mg total) by mouth 3 (three) times daily as needed. Patient taking differently: Take 10 mg by mouth 3 (three) times daily as needed for muscle spasms.  01/21/17   Joni Reining, PA-C  docusate sodium (STOOL SOFTENER) 100 MG capsule Take 100 mg by mouth 2 (two) times daily.  01/02/14   [provider]  elvitegravir-cobicistat-emtricitabine-tenofovir (STRIBILD) 150-150-200-300 MG TABS tablet Take 1 tablet by mouth daily with breakfast.  03/26/16   [provider]  ezetimibe (ZETIA) 10 MG tablet Take 10 mg by mouth daily.    [provider]  hydrocortisone 2.5 % cream Apply 1 application topically daily as needed.     [provider]  hydrOXYzine (ATARAX/VISTARIL) 25 MG tablet Take 25-50 mg by mouth See admin instructions. Take 25 mg by mouth in the morning and take 50 mg by mouth at bedtime 04/22/15   [provider]  ibuprofen (ADVIL,MOTRIN) 800 MG tablet Take 1 tablet (800 mg total) by mouth every 8 (  eight) hours as needed for moderate pain. Patient taking differently: Take 600 mg by mouth every 6 (six) hours as needed for moderate pain.  07/09/15   Joni Reining, PA-C  insulin NPH Human (HUMULIN N,NOVOLIN N) 100 UNIT/ML injection Inject 5-10 Units into the skin See admin instructions. Inject 10 units SQ in the morning and inject 5 units SQ before supper    [provider]  levETIRAcetam (KEPPRA) 500 MG tablet Take 1,000 mg by mouth 2 (two) times daily.  03/27/16 03/27/17  [provider]  LORazepam (ATIVAN) 0.5 MG tablet Take 0.5 mg by mouth every 6 (six) hours as needed (for agitation).     [provider]  medroxyPROGESTERone (DEPO-PROVERA) 150 MG/ML injection Inject 150 mg into the muscle every 3 (three) months.  07/27/15   [provider]  miconazole (MICONAZOLE 7) 100 MG vaginal suppository Place 100 mg vaginally at bedtime as needed.    [provider]  Multiple Vitamins-Minerals (MULTIVITAMIN PO) Take 1 tablet by mouth daily.    [provider]  omeprazole (PRILOSEC) 20 MG capsule Take 40 mg by mouth 2 (two) times daily.     [provider]  ondansetron (ZOFRAN) 4 MG tablet Take 4 mg by mouth 3 (three) times daily as needed for nausea or vomiting.    [provider]  oxyCODONE-acetaminophen (PERCOCET) 7.5-325 MG tablet Take 1 tablet by mouth every 4 (four) hours as needed for severe pain. 01/25/17   Violeta Gelinas, MD  polyethylene glycol [facility] / Ethelene Hal) packet Take 17 g by mouth daily as needed for moderate constipation.     [provider]  potassium chloride (K-DUR) 10 MEQ tablet Take by mouth.    [provider]  Probiotic Product (PROBIOTIC COLON SUPPORT PO) Take 1 capsule by mouth daily.    [provider]  risperiDONE (RISPERDAL) 2 MG tablet Take 2 mg by mouth 2 (two) times daily.     [provider]  umeclidinium bromide (INCRUSE ELLIPTA) 62.5 MCG/INH AEPB Inhale 1 puff into the lungs daily.    [provider]  UNABLE TO FIND Inject into the skin. INSULIN NPH HUM/REG INSULIN HM (NOVOLIN 70/30 SUBQ)    [provider]  varenicline (CHANTIX) 1 MG tablet Take 1 mg by mouth 2 (two) times daily.     [provider]    Allergies Penicillins  Family History  Family history unknown: Yes    Social History Social History   Tobacco Use  . Smoking status: Current Every Day Smoker    Packs/day: 0.20    Types: Cigarettes  . Smokeless tobacco:  Never Used  Substance Use Topics  . Alcohol use: No  . Drug use: Yes    Types: Marijuana     Review of Systems  Constitutional: No fever/chills Eyes: No visual changes. No discharge ENT: No upper respiratory complaints. Cardiovascular: no chest pain. Respiratory: no cough. No SOB. Musculoskeletal: Patient has right elbow and right hip pain. Skin: Negative for rash, abrasions, lacerations, ecchymosis. Neurological: Negative for headaches, focal weakness or numbness.   ____________________________________________   PHYSICAL EXAM:  VITAL SIGNS: ED Triage Vitals [01/14/18 1959]  Enc Vitals Group     BP 133/86     Pulse Rate (!) 102     Resp 20     Temp 98.3 F (36.8 C)     Temp Source Oral     SpO2 96 %     Weight 210 lb (95.3 kg)  Height 5\' 5"  (1.651 m)     Head Circumference      Peak Flow      Pain Score 7     Pain Loc      Pain Edu?      Excl. in GC?      Constitutional: Alert and oriented. Well appearing and in no acute distress. Eyes: Conjunctivae are normal. PERRL. EOMI. Head: Atraumatic. Cardiovascular: Normal rate, regular rhythm. Normal S1 and S2.  Good peripheral circulation. Respiratory: Normal respiratory effort without tachypnea or retractions. Lungs CTAB. Good air entry to the bases with no decreased or absent breath sounds. Musculoskeletal: Patient is able to perform full range of motion of the right shoulder, right elbow and right wrist.  She is able to perform pronation and supination without difficulty.  No pain was elicited with internal and external rotation of the right hip.  Patient was able to move all 5 right toes.  Negative straight leg raise test, right.  Palpable dorsalis pedis pulse bilaterally and symmetrically. Neurologic:  Normal speech and language. No gross focal neurologic deficits are appreciated.  Skin:  Skin is warm, dry and intact. No rash noted. Psychiatric: Mood and affect are normal. Speech and behavior are normal. Patient  exhibits appropriate insight and judgement.   ____________________________________________   LABS (all labs ordered are listed, but only abnormal results are displayed)  Labs Reviewed - No data to display ____________________________________________  EKG   ____________________________________________  RADIOLOGY Geraldo Pitter, personally viewed and evaluated these images (plain radiographs) as part of my medical decision making, as well as reviewing the written report by the radiologist.  Dg Elbow Complete Right  Result Date: 01/14/2018 CLINICAL DATA:  42 year old female with fall and right elbow pain. EXAM: RIGHT ELBOW - COMPLETE 3+ VIEW COMPARISON:  None. FINDINGS: There is no evidence of fracture, dislocation, or joint effusion. There is no evidence of arthropathy or other focal bone abnormality. Soft tissues are unremarkable. IMPRESSION: Negative. Electronically Signed   By: Elgie Collard M.D.   On: 01/14/2018 22:45   Dg Hip Unilat W Or Wo Pelvis 2-3 Views Right  Result Date: 01/14/2018 CLINICAL DATA:  Fall with right hip pain EXAM: DG HIP (WITH OR WITHOUT PELVIS) 2-3V RIGHT COMPARISON:  None. FINDINGS: No fracture or dislocation of the right hip. No pelvic fracture or diastasis. No hip osteoarthrosis. IMPRESSION: No fracture or dislocation of the right hip. Electronically Signed   By: Deatra Robinson M.D.   On: 01/14/2018 22:54    ____________________________________________    PROCEDURES  Procedure(s) performed:    Procedures    Medications  acetaminophen (TYLENOL) 500 MG tablet (  Not Given 01/14/18 2349)  acetaminophen (TYLENOL) tablet 1,000 mg (1,000 mg Oral Given 01/14/18 2349)     ____________________________________________   INITIAL IMPRESSION / ASSESSMENT AND PLAN / ED COURSE  Pertinent labs & imaging results that were available during my care of the patient were reviewed by me and considered in my medical decision making (see chart for  details).  Review of the [place] CSRS was performed in accordance of the [facility] prior to dispensing any controlled drugs.    Assessment and plan Fall Patient presents to the emergency department with right shoulder, right elbow and right wrist pain after a fall.  Differential diagnosis included fracture versus contusion versus ligamentous sprain.  X-ray examination revealed no acute fractures or bony abnormalities.  Patient was given Tylenol for pain and advised to use Tylenol for any further discomfort.  Patient was observed ambulating throughout emergency department without difficulty.  Patient was advised to follow-up with primary care as needed.  All patient questions were answered.    ____________________________________________  FINAL CLINICAL IMPRESSION(S) / ED DIAGNOSES  Final diagnoses:  Fall, initial encounter      NEW MEDICATIONS STARTED DURING THIS VISIT:  ED Discharge Orders    None          This chart was dictated using voice recognition software/Dragon. Despite best efforts to proofread, errors can occur which can change the meaning. Any change was purely unintentional.    Orvil FeilWoods, Cardarius Senat M, PA-C 01/15/18 Lauree Chandler0020    Quale, Mark, MD 01/15/18 [phone]

## 2018-01-28 IMAGING — CR DG CHEST 2V
1 series · 2 of 2 positions shown · non-contrast
Comparison: 08/25/2015.  08/04/2013.

CLINICAL DATA: Emesis.  URI.

EXAM:
CHEST  2 VIEW

[Series 1: dg chest 2 view · 0.14mm/px · 2 of 2 slices shown]
[im 1/2]
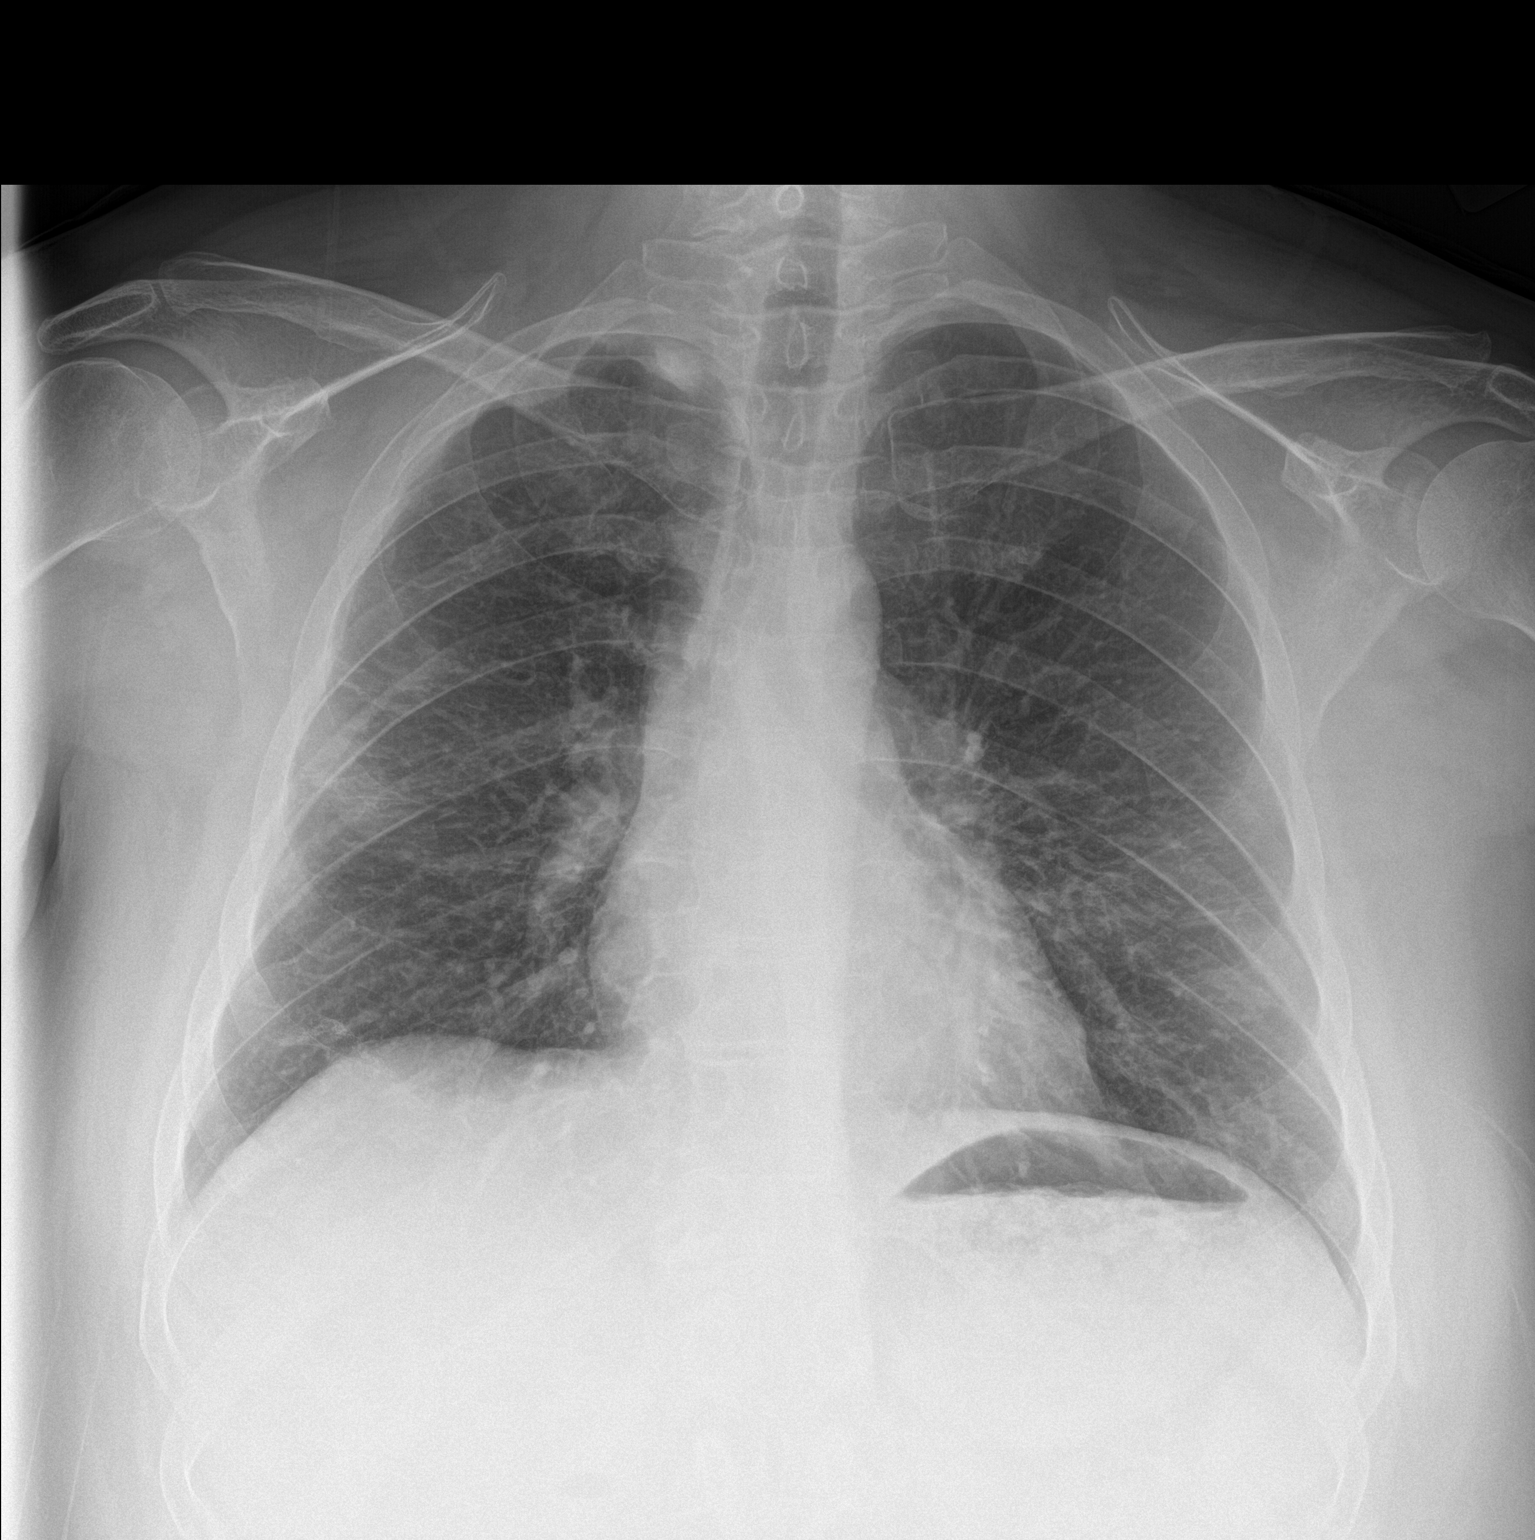
[im 2/2]
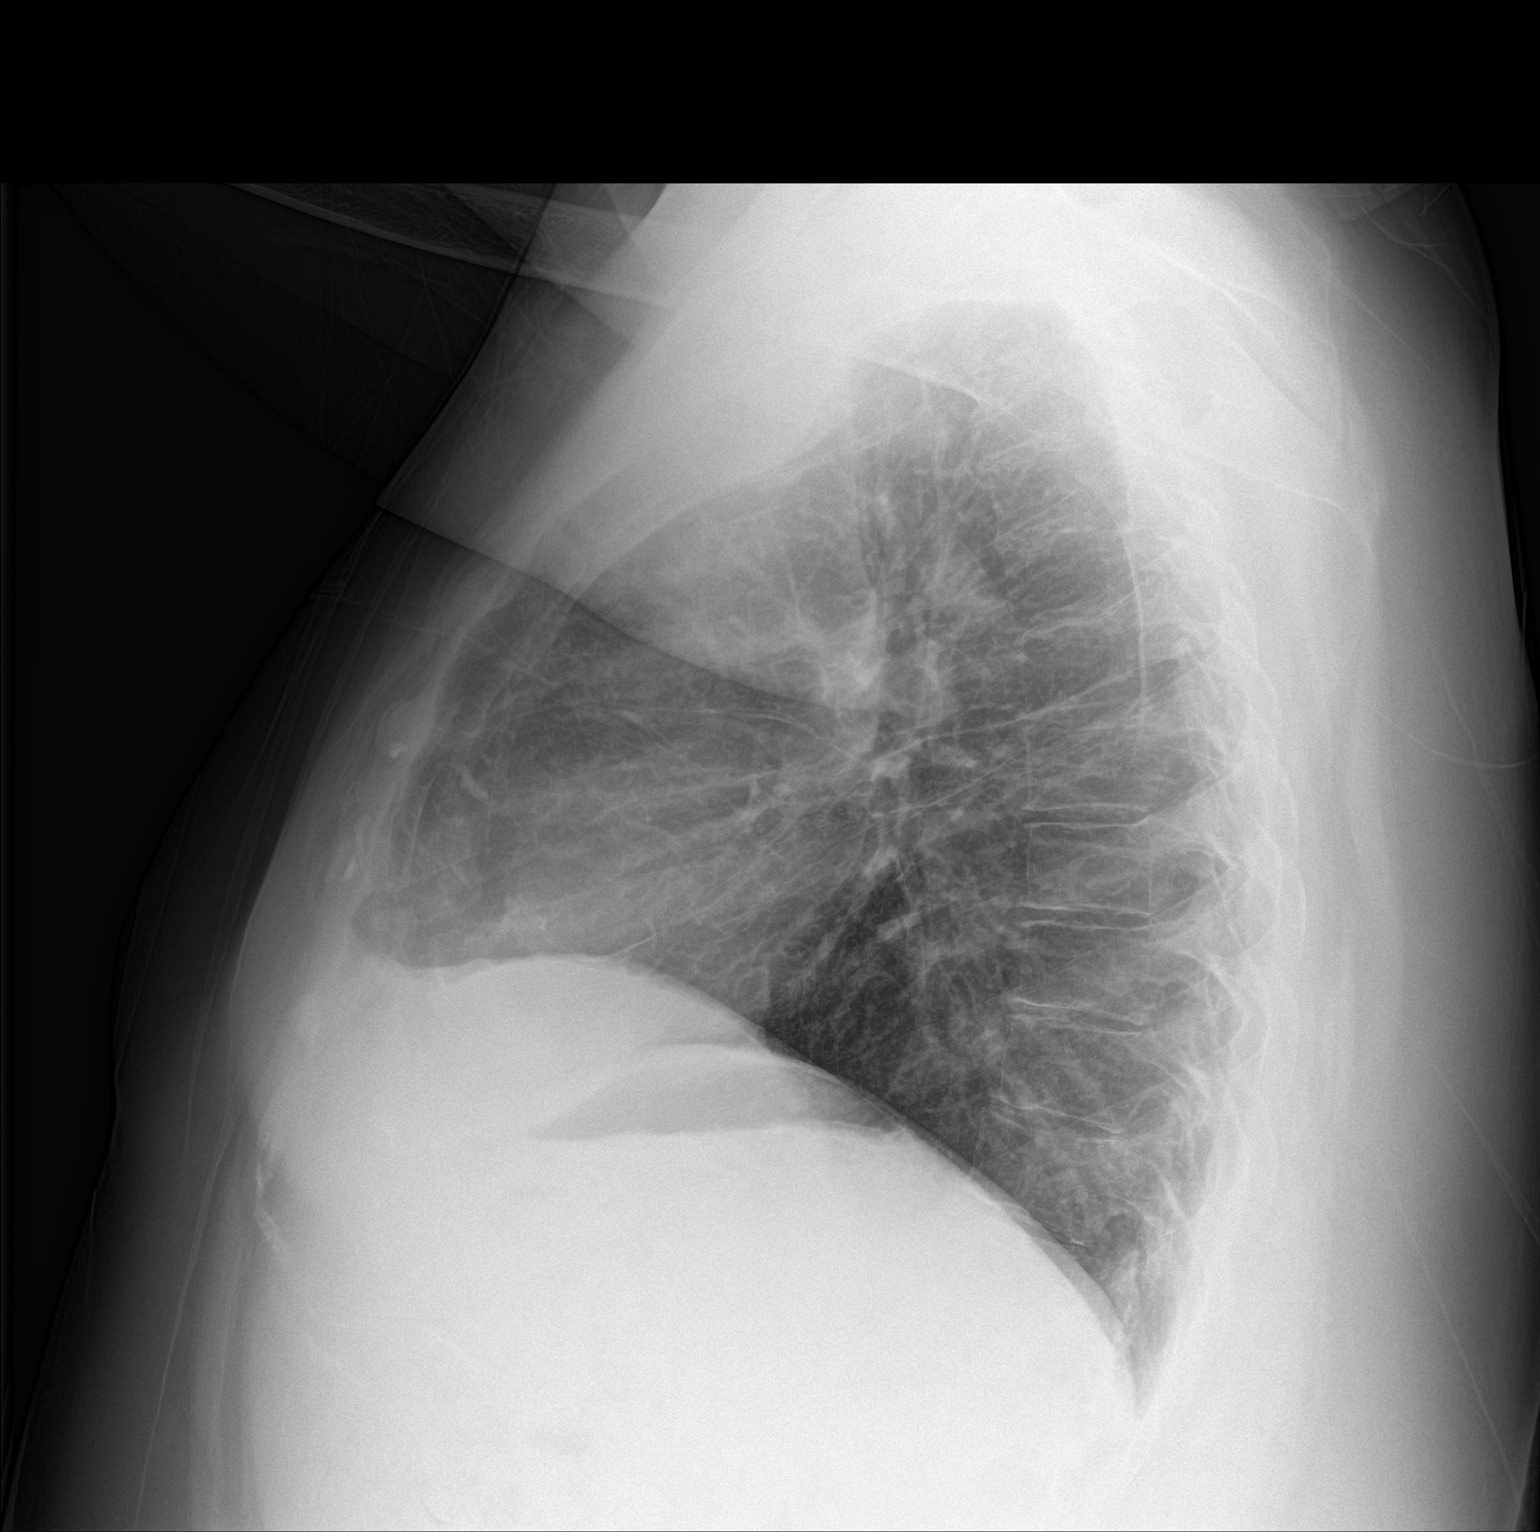

[2 of 2 positions shown; findings below may reference images not displayed]

FINDINGS: Mediastinum and hilar structures normal. Heart size normal. Surgical
sutures right lung base. Bilateral pulmonary interstitial prominence
noted. A component of these changes may be chronic however active
interstitial lung disease including pneumonitis cannot be excluded.
No pleural effusion or pneumothorax. Pleural-based densities noted
over the right apex. Although these changes may be related to
pleural parenchymal scarring apical mass lesions cannot be excluded.
Contrast-enhanced chest CT suggested for further evaluation. No
pneumothorax. No acute bony abnormality identified.
IMPRESSION: 1. Interstitial prominence noted bilaterally. Although a component
these changes may be related chronic interstitial lung disease
active interstitial lung disease including pneumonitis cannot be
excluded.

2. Right apical pleural densities are noted. Although these changes
may be related scarring apical pulmonary mass lesions cannot be
excluded. Contrast-enhanced chest CT suggested for further
evaluation .

## 2018-01-28 IMAGING — CT CT CHEST W/ CM
2 of 3 series · 15 of 36 positions shown, 18 images · IV contrast (iopamidol)
Comparison: Chest radiographs 11/21/2016, prior CT chest exams of
02/27/2013 and 02/17/2013

CLINICAL DATA: Abnormal chest radiograph with interstitial
prominence in both lungs as well as RIGHT apical pleural densities 8

EXAM:
CT CHEST WITH CONTRAST
TECHNIQUE: Multidetector CT imaging of the chest was performed during
intravenous contrast administration. Sagittal and coronal MPR images
reconstructed from axial data set.
CONTRAST:  75mL NAKT8G-CVV IOPAMIDOL (NAKT8G-CVV) INJECTION 61% IV

[Series 2: axial st · axial · 0.76mm/px · z∈[-470,-232]mm · 12 of 141 slices shown, 15 images]
[im 11/141  mediastinal]
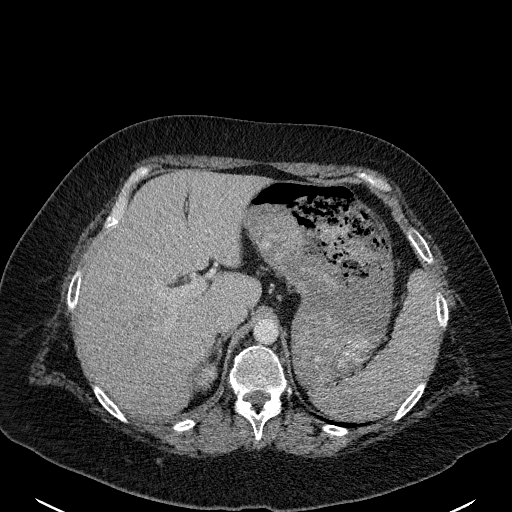
[im 11/141  lung]
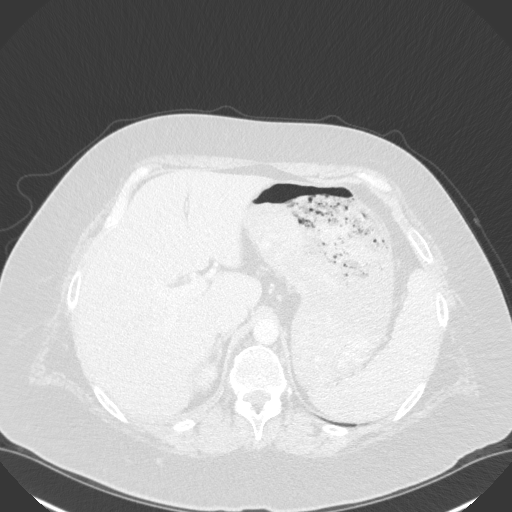
[im 21/141  lung]
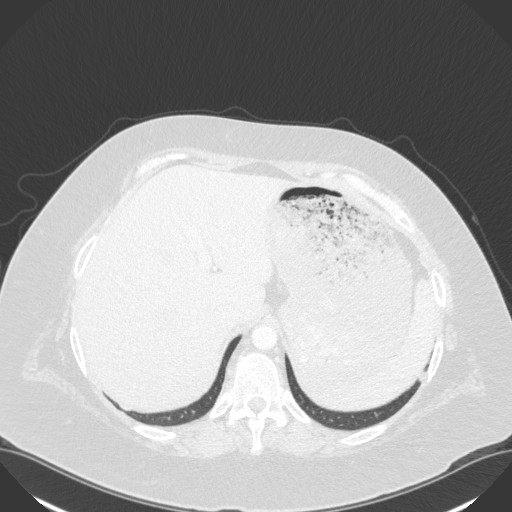
[im 32/141  lung]
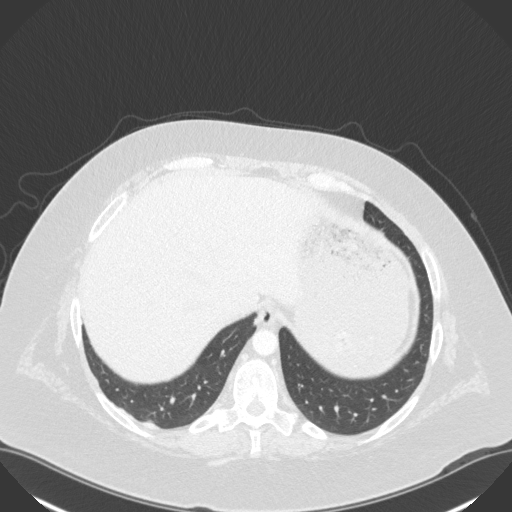
[im 42/141  lung]
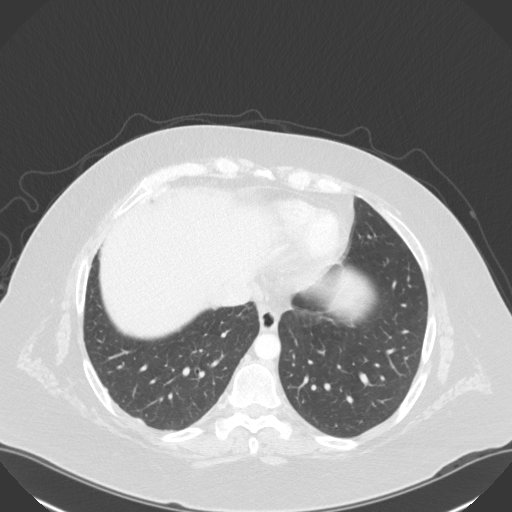
[im 52/141  mediastinal]
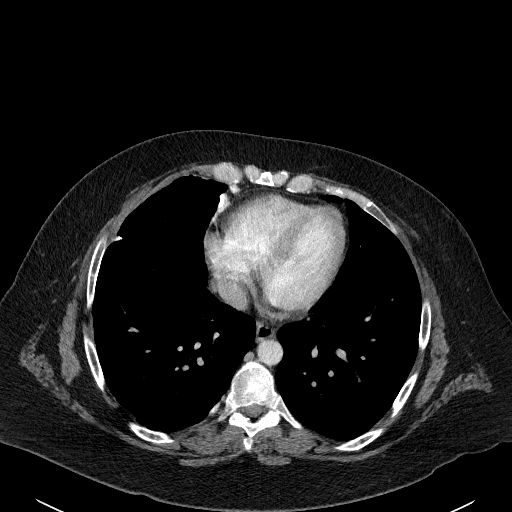
[im 52/141  lung]
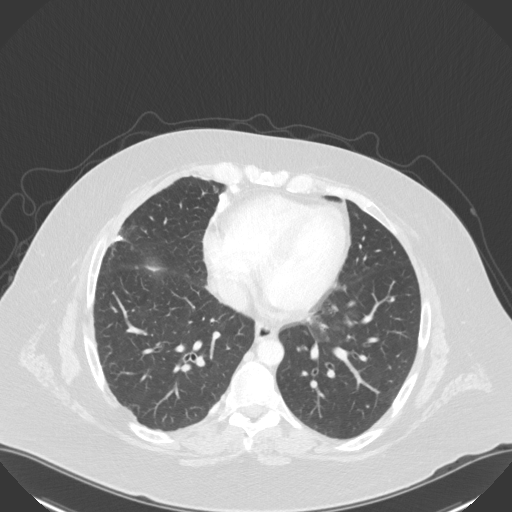
[im 63/141  lung]
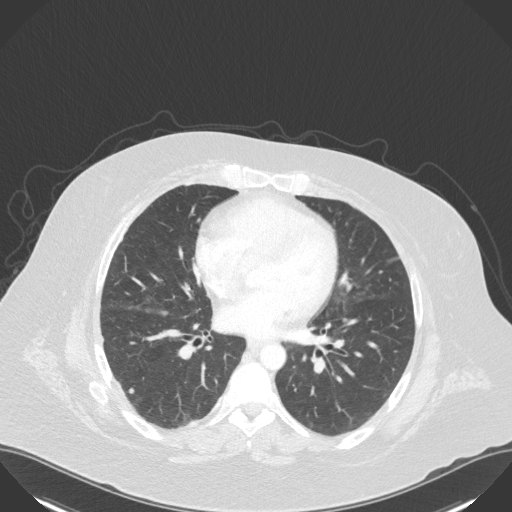
[im 78/141  lung]
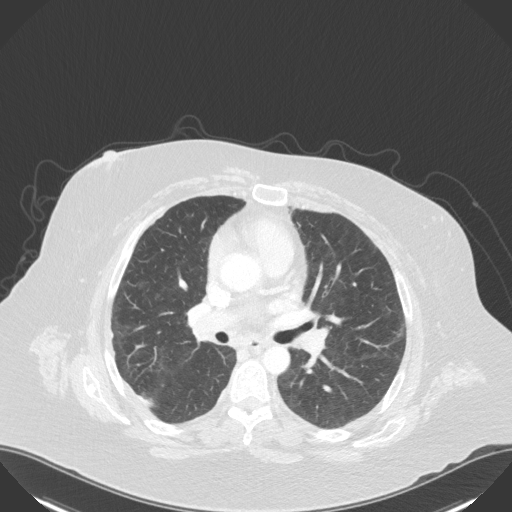
[im 89/141  lung]
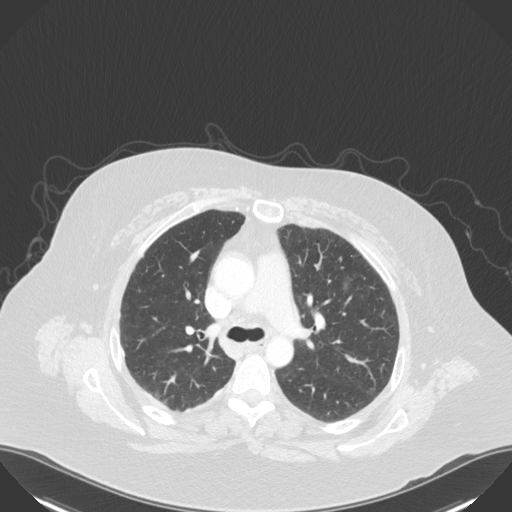
[im 99/141  mediastinal]
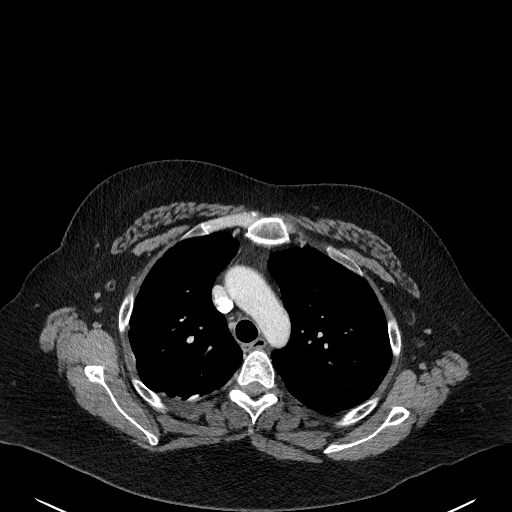
[im 99/141  lung]
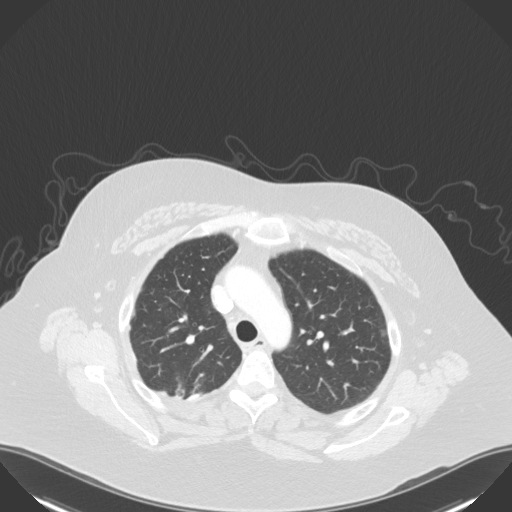
[im 109/141  lung]
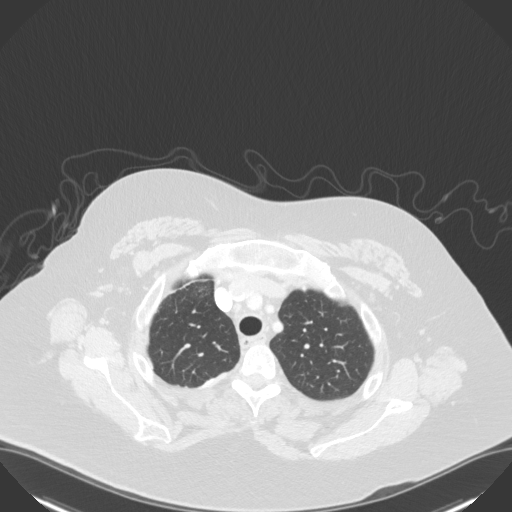
[im 120/141  lung]
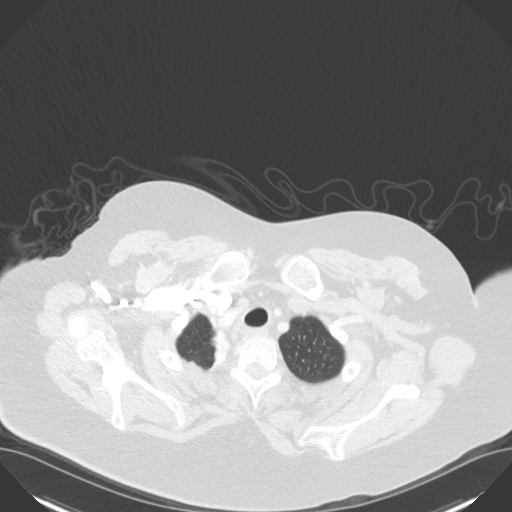
[im 130/141  lung]
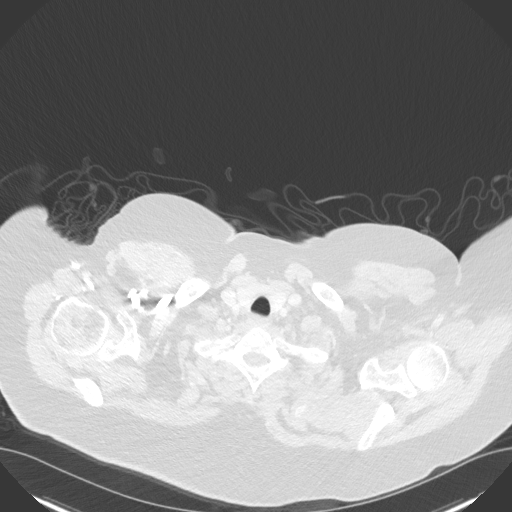

[Series 5: coronal · coronal · 0.57mm/px · 3 of 131 slices shown]
[im 27/131  lung]
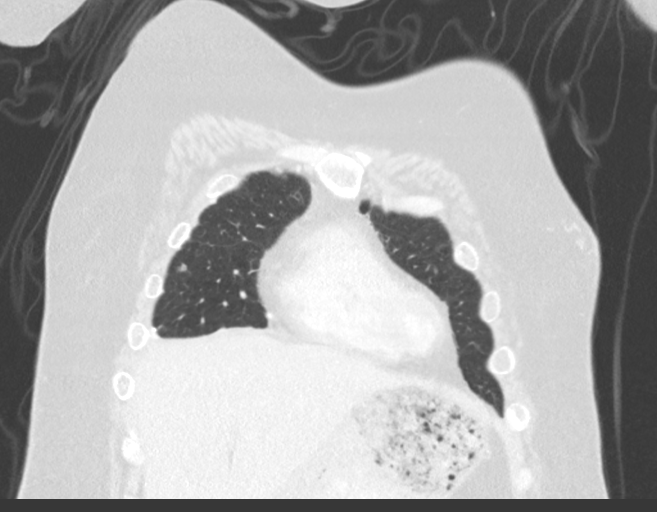
[im 53/131  lung]
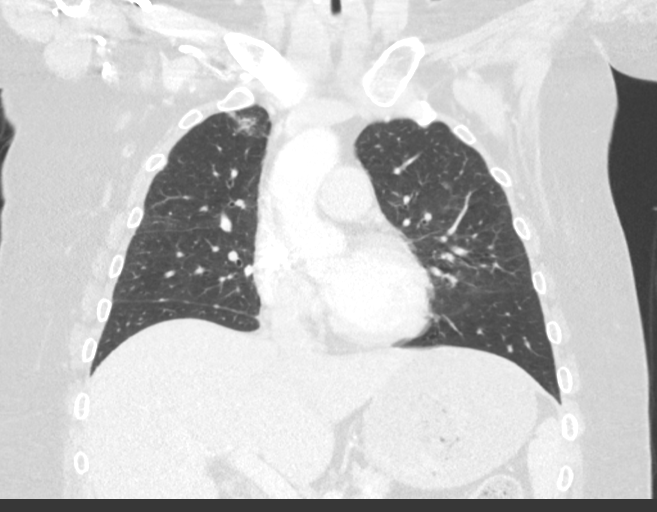
[im 79/131  lung]
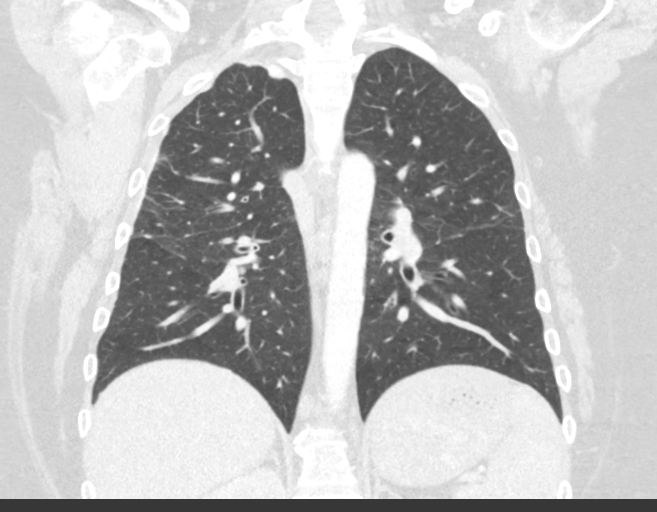

[15 of 36 positions shown; findings below may reference images not displayed]

FINDINGS: Cardiovascular: Aorta normal caliber. Mild coronary arterial
calcification at LAD. No pericardial effusion.

Mediastinum/Nodes: Few scattered normal sized mediastinal nodes.
Small normal size nodes at the base of the cervical region
particularly on LEFT. No thoracic adenopathy. Esophagus
unremarkable.

Lungs/Pleura: Scattered pleural nodularity throughout the RIGHT
hemithorax, including along fissures, with some of the observed
pleural plaques being calcified. Improved interstitial changes since
prior studies from 5226. Intraparenchymal nodule RIGHT lower lobe 3
mm diameter image 79 partially calcified. Linear scarring at RIGHT
apex. No acute infiltrate, pleural effusion or pneumothorax.

Upper Abdomen: Unremarkable

Musculoskeletal: No acute osseous findings.
IMPRESSION: Partially calcified pleural plaque formation/pleural nodularity
throughout the RIGHT hemithorax.

In 5226 patient had a large loculated pneumothorax, chest tube, and
severe interstitial lung disease changes which have improved, with
minimal residual scarring in the RIGHT upper lobe.

The observed RIGHT pleural process may represent sequela of prior
empyema, pneumothorax, or TB.

Calcified granuloma RIGHT lower lobe.

No definite parenchymal lung nodules otherwise identified.

Mild premature coronary arterial calcification at for age.

## 2018-02-17 ENCOUNTER — Ambulatory Visit: Payer: Medicare Other | Admitting: Podiatry

## 2018-03-13 ENCOUNTER — Encounter: Payer: Self-pay | Admitting: Podiatry

## 2018-03-13 ENCOUNTER — Ambulatory Visit (INDEPENDENT_AMBULATORY_CARE_PROVIDER_SITE_OTHER): Payer: Medicare Other | Admitting: Podiatry

## 2018-03-13 DIAGNOSIS — B351 Tinea unguium: Secondary | ICD-10-CM

## 2018-03-13 DIAGNOSIS — M79609 Pain in unspecified limb: Secondary | ICD-10-CM | POA: Diagnosis not present

## 2018-03-13 NOTE — Progress Notes (Signed)
Complaint:  Visit Type: Patient returns to my office for continued preventative foot care services. Complaint: Patient states" my nails have grown long and thick and become painful to walk and wear shoes" Patient has been diagnosed with DM with no foot complications. The patient presents for preventative foot care services. No changes to ROS.  Left big toenail fell off previously and been growing back well. Podiatric Exam: Vascular: dorsalis pedis and posterior tibial pulses are palpable bilateral. Capillary return is immediate. Temperature gradient is WNL. Skin turgor WNL  Sensorium: Normal Semmes Weinstein monofilament test. Normal tactile sensation bilaterally. Nail Exam: Pt has thick disfigured discolored nails with subungual debris noted bilateral entire nail hallux through fifth toenails.  The left hallux toenail has self avulsed with no evidence of any redness, swelling or drainage Ulcer Exam: There is no evidence of ulcer or pre-ulcerative changes or infection. Orthopedic Exam: Muscle tone and strength are WNL. No limitations in general ROM. No crepitus or effusions noted. Foot type and digits show no abnormalities. Bony prominences are unremarkable. Skin: No Porokeratosis. No infection or ulcers  Diagnosis:  Onychomycosis, , Pain in right toe, pain in left toes  Treatment & Plan Procedures and Treatment: Consent by patient was obtained for treatment procedures. The patient understood the discussion of treatment and procedures well. All questions were answered thoroughly reviewed. Debridement of mycotic and hypertrophic toenails, 1 through 5 bilateral and clearing of subungual debris. No ulceration, no infection noted.  Return Visit-Office Procedure: Patient instructed to return to the office for a follow up visit 6 months for continued evaluation and treatment.    Helane GuntherGregory Sagan Wurzel DPM

## 2018-03-30 IMAGING — CR DG LUMBAR SPINE COMPLETE 4+V
1 series · 5 of 5 positions shown · non-contrast
Comparison: None.

CLINICAL DATA: Low back pain since a slip and fall on snow
01/19/2015. Initial encounter.

EXAM:
LUMBAR SPINE - COMPLETE 4+ VIEW

[Series 1: t lumbar spine ap · 0.14mm/px · 5 of 5 slices shown]
[im 1/5]
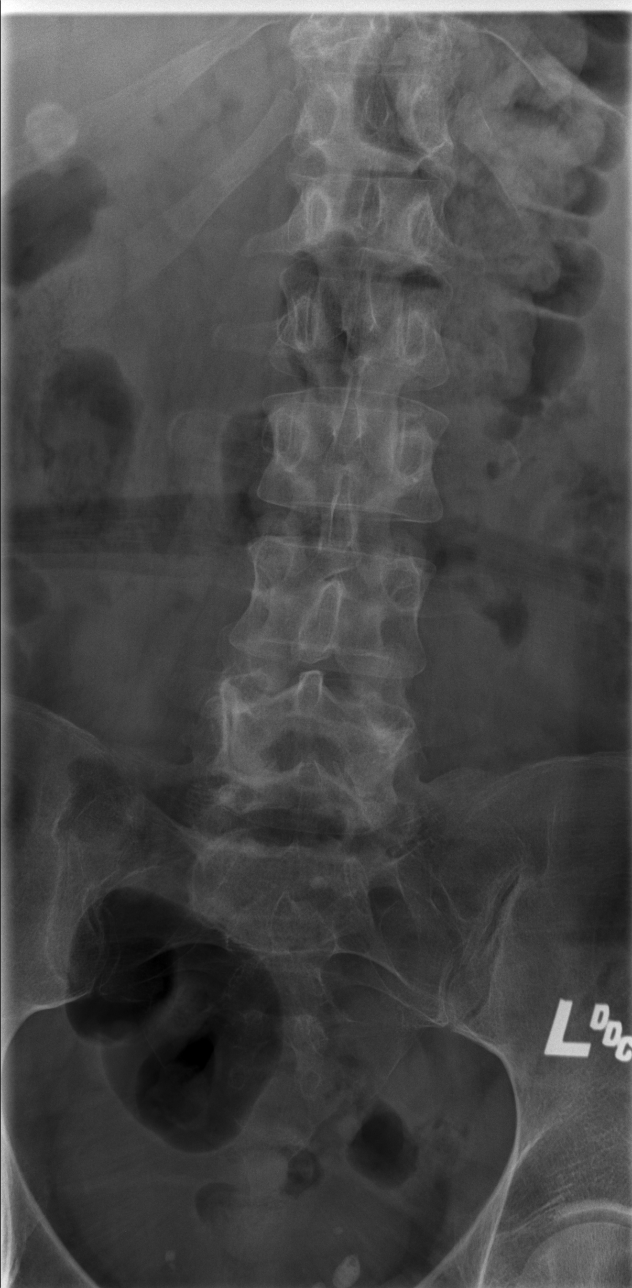
[im 2/5]
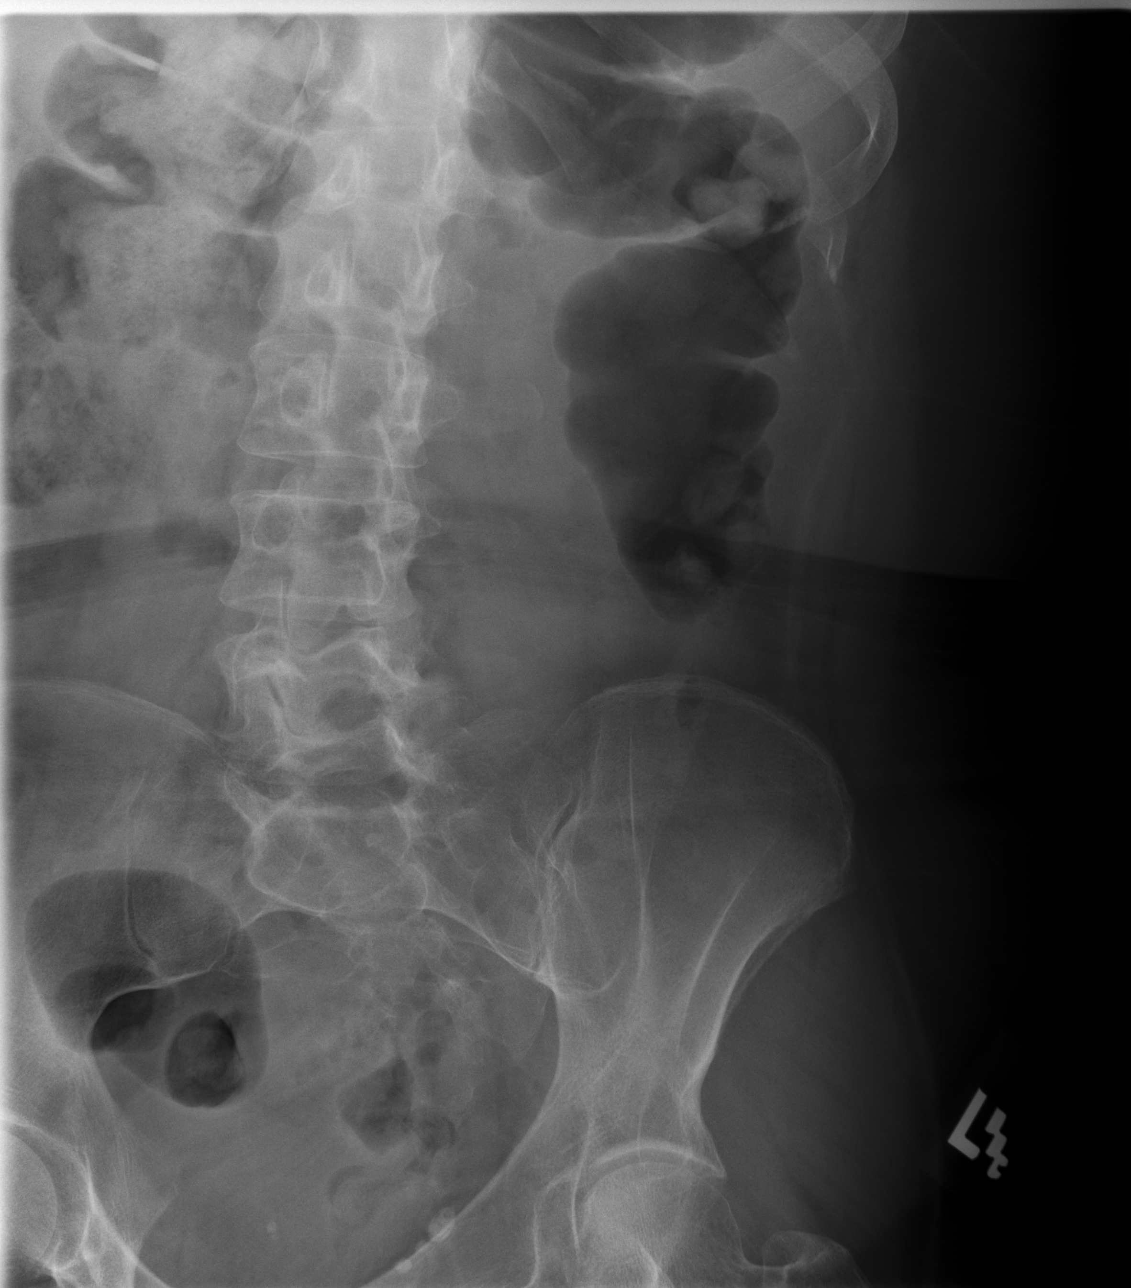
[im 3/5]
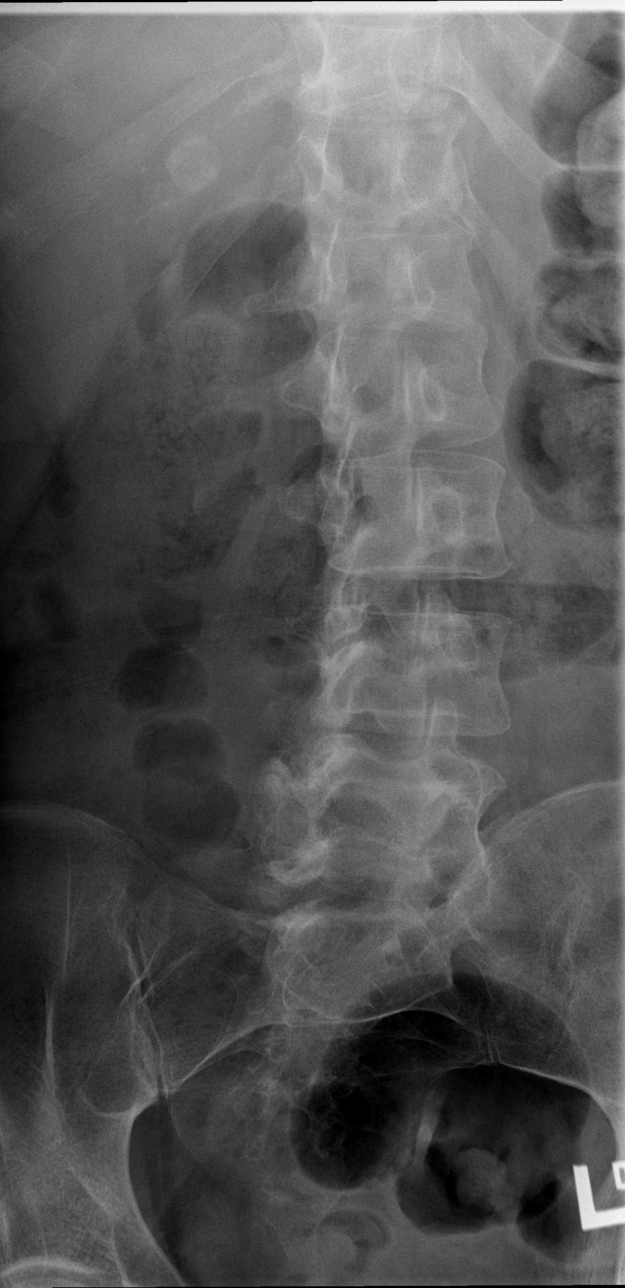
[im 4/5]
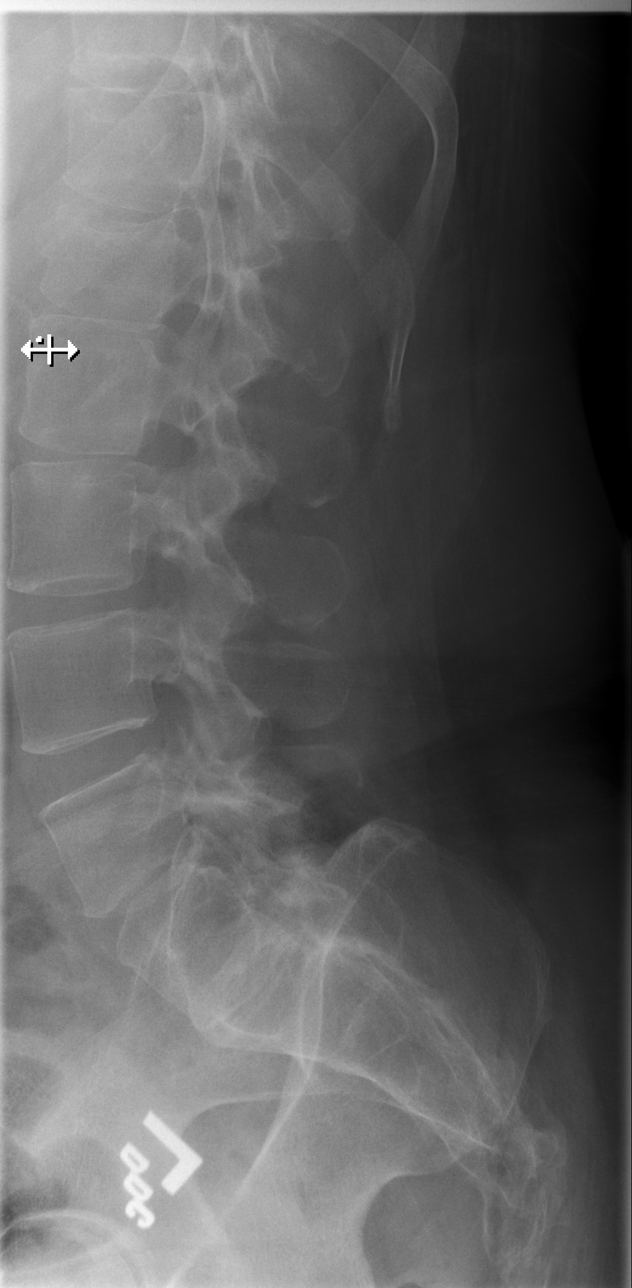
[im 5/5]
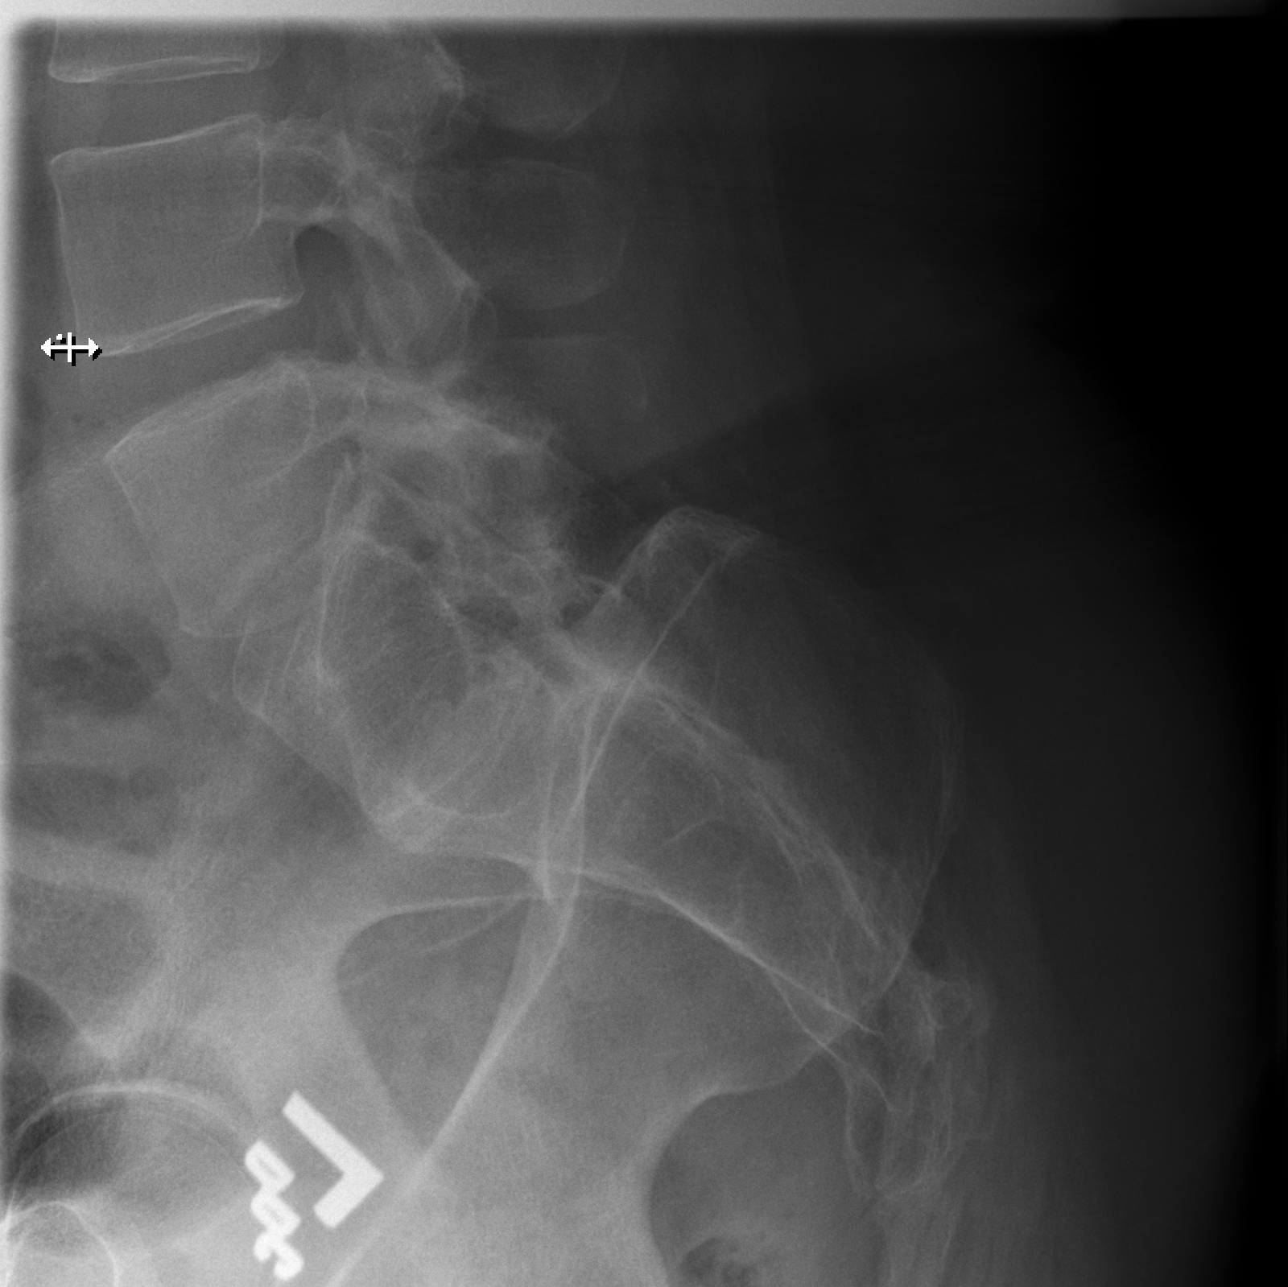

[5 of 5 positions shown; findings below may reference images not displayed]

FINDINGS: There is no evidence of lumbar spine fracture. Alignment is normal.
Intervertebral disc spaces are maintained. Facet degenerative
disease L5-S1 is noted.
IMPRESSION: Negative.

## 2018-07-14 ENCOUNTER — Encounter: Payer: Self-pay | Admitting: Podiatry

## 2018-07-14 ENCOUNTER — Ambulatory Visit (INDEPENDENT_AMBULATORY_CARE_PROVIDER_SITE_OTHER): Payer: Medicare Other | Admitting: Podiatry

## 2018-07-14 DIAGNOSIS — M79609 Pain in unspecified limb: Secondary | ICD-10-CM | POA: Diagnosis not present

## 2018-07-14 DIAGNOSIS — B351 Tinea unguium: Secondary | ICD-10-CM

## 2018-07-14 DIAGNOSIS — E119 Type 2 diabetes mellitus without complications: Secondary | ICD-10-CM

## 2018-07-14 NOTE — Progress Notes (Signed)
Complaint:  Visit Type: Patient returns to my office for continued preventative foot care services. Complaint: Patient states" my nails have grown long and thick and become painful to walk and wear shoes" Patient has been diagnosed with DM with no foot complications. The patient presents for preventative foot care services. No changes to ROS.  . Podiatric Exam: Vascular: dorsalis pedis and posterior tibial pulses are palpable bilateral. Capillary return is immediate. Temperature gradient is WNL. Skin turgor WNL  Sensorium: Normal Semmes Weinstein monofilament test. Normal tactile sensation bilaterally. Nail Exam: Pt has thick disfigured discolored nails with subungual debris noted bilateral entire nail hallux through fifth toenails.  The left hallux toenail has self avulsed with no evidence of any redness, swelling or drainage Ulcer Exam: There is no evidence of ulcer or pre-ulcerative changes or infection. Orthopedic Exam: Muscle tone and strength are WNL. No limitations in general ROM. No crepitus or effusions noted. Foot type and digits show no abnormalities. HAV  B/L. Skin: No Porokeratosis. No infection or ulcers.  Keratoderma climacticum  B/L  Diagnosis:  Onychomycosis, , Pain in right toe, pain in left toes  Treatment & Plan Procedures and Treatment: Consent by patient was obtained for treatment procedures. The patient understood the discussion of treatment and procedures well. All questions were answered thoroughly reviewed. Debridement of mycotic and hypertrophic toenails, 1 through 5 bilateral and clearing of subungual debris. No ulceration, no infection noted.  Return Visit-Office Procedure: Patient instructed to return to the office for a follow up visit 4 months for continued evaluation and treatment.    Tyrann Donaho DPM 

## 2018-11-10 ENCOUNTER — Ambulatory Visit: Payer: Medicare Other | Admitting: Podiatry

## 2018-12-04 ENCOUNTER — Encounter: Payer: Self-pay | Admitting: Podiatry

## 2018-12-04 ENCOUNTER — Ambulatory Visit (INDEPENDENT_AMBULATORY_CARE_PROVIDER_SITE_OTHER): Payer: Medicare Other | Admitting: Podiatry

## 2018-12-04 DIAGNOSIS — B351 Tinea unguium: Secondary | ICD-10-CM | POA: Diagnosis not present

## 2018-12-04 DIAGNOSIS — M79609 Pain in unspecified limb: Secondary | ICD-10-CM

## 2018-12-04 DIAGNOSIS — E119 Type 2 diabetes mellitus without complications: Secondary | ICD-10-CM

## 2018-12-04 NOTE — Progress Notes (Signed)
Complaint:  Visit Type: Patient returns to my office for continued preventative foot care services. Complaint: Patient states" my nails have grown long and thick and become painful to walk and wear shoes" Patient has been diagnosed with DM with no foot complications. The patient presents for preventative foot care services. No changes to ROS.  Marland Kitchen. Podiatric Exam: Vascular: dorsalis pedis and posterior tibial pulses are palpable bilateral. Capillary return is immediate. Temperature gradient is WNL. Skin turgor WNL  Sensorium: Normal Semmes Weinstein monofilament test. Normal tactile sensation bilaterally. Nail Exam: Pt has thick disfigured discolored nails with subungual debris noted bilateral entire nail hallux through fifth toenails.  The left hallux toenail has self avulsed with no evidence of any redness, swelling or drainage Ulcer Exam: There is no evidence of ulcer or pre-ulcerative changes or infection. Orthopedic Exam: Muscle tone and strength are WNL. No limitations in general ROM. No crepitus or effusions noted. Foot type and digits show no abnormalities. HAV  B/L. Skin: No Porokeratosis. No infection or ulcers.  Keratoderma climacticum  B/L  Diagnosis:  Onychomycosis, , Pain in right toe, pain in left toes  Treatment & Plan Procedures and Treatment: Consent by patient was obtained for treatment procedures. The patient understood the discussion of treatment and procedures well. All questions were answered thoroughly reviewed. Debridement of mycotic and hypertrophic toenails, 1 through 5 bilateral and clearing of subungual debris. No ulceration, no infection noted.  Return Visit-Office Procedure: Patient instructed to return to the office for a follow up visit 4 months for continued evaluation and treatment.    Helane GuntherGregory Arlinda Barcelona DPM

## 2019-03-05 ENCOUNTER — Ambulatory Visit: Payer: Medicare Other | Admitting: *Deleted

## 2019-03-10 ENCOUNTER — Encounter: Payer: Medicare Other | Attending: Family Medicine | Admitting: *Deleted

## 2019-03-10 ENCOUNTER — Encounter: Payer: Self-pay | Admitting: *Deleted

## 2019-03-10 VITALS — BP 96/60 | Ht 66.0 in | Wt 232.5 lb

## 2019-03-10 DIAGNOSIS — Z6837 Body mass index (BMI) 37.0-37.9, adult: Secondary | ICD-10-CM | POA: Diagnosis not present

## 2019-03-10 DIAGNOSIS — E119 Type 2 diabetes mellitus without complications: Secondary | ICD-10-CM | POA: Insufficient documentation

## 2019-03-10 DIAGNOSIS — Z713 Dietary counseling and surveillance: Secondary | ICD-10-CM | POA: Insufficient documentation

## 2019-03-10 DIAGNOSIS — Z794 Long term (current) use of insulin: Secondary | ICD-10-CM

## 2019-03-10 NOTE — Patient Instructions (Signed)
Check blood sugars 2 x day before breakfast and before supper every day Bring blood sugar records to the next appointment or class  Exercise: continue exercises per Physical Therapy for  60 minutes  5  days a week  Eat 3 meals day, 1-2 snacks a day Space meals 4-6 hours apart Avoid sugar sweetened drinks (juices) unless treating a low blood sugar  Carry fast acting glucose (glucose tablets or soft mints) and a snack at all times Mix cloudy insulin before each injection (don't shake) Rotate injection sites Insulin pen in use can stay out of the refrigerator (insulin pens not in use stay in refrigerator) Hold pen in place for 5-10 seconds after injection  Novolin N insulin 10 units before breakfast Novolin N insulin   5 units before supper  Return for appointment or classes on:

## 2019-03-10 NOTE — Progress Notes (Signed)
Diabetes Self-Management Education  Visit Type: First/Initial  Appt. Start Time: 1545 Appt. End Time: 1705  03/10/2019  Ms. Kristen Villegas, identified by name and date of birth, is a 43 y.o. female with a diagnosis of Diabetes: Type 2.   ASSESSMENT  Blood pressure 96/60, height 5\' 6"  (1.676 m), weight 232 lb 8 oz (105.5 kg). Body mass index is 37.53 kg/m.  Diabetes Self-Management Education - 03/10/19 1726      Visit Information   Visit Type  First/Initial      Initial Visit   Diabetes Type  Type 2    Are you currently following a meal plan?  No    Are you taking your medications as prescribed?  Yes   Medications are given to patient at [facility]. Asked pt and transportation Zella Ball) to bring complete list to next appointment or class.    Date Diagnosed  2 years ago      Health Coping   How would you rate your overall health?  Good      Psychosocial Assessment   Patient Belief/Attitude about Diabetes  Other (comment)   feels "ok" about diabetes   Self-care barriers  Other (comment)   developmental delays   Self-management support  Doctor's office;Friends   care home   Other persons present  Other (comment)   transportation lady Robin completed her paperwork, left during initial visit and returned to receive discharge instructions   Patient Concerns  Nutrition/Meal planning;Weight Control;Medication;Monitoring;Healthy Lifestyle;Glycemic Control    Special Needs  Instruct caregiver;Other (comment)    Preferred Learning Style  Auditory;Hands on    Learning Readiness  Ready    How often do you need to have someone help you when you read instructions, pamphlets, or other written materials from your doctor or pharmacy?  3 - Sometimes    What is the last grade level you completed in school?  10th - reports she is completing her GED      Pre-Education Assessment   Patient understands the diabetes disease and treatment process.  Needs Instruction    Patient understands  incorporating nutritional management into lifestyle.  Needs Instruction    Patient undertands incorporating physical activity into lifestyle.  Needs Review    Patient understands using medications safely.  Needs Instruction    Patient understands monitoring blood glucose, interpreting and using results  Needs Review    Patient understands prevention, detection, and treatment of acute complications.  Needs Instruction    Patient understands prevention, detection, and treatment of chronic complications.  Needs Instruction    Patient understands how to develop strategies to address psychosocial issues.  Needs Instruction    Patient understands how to develop strategies to promote health/change behavior.  Needs Instruction      Complications   Last HgB A1C per patient/outside source  7 %   12/29/18   How often do you check your blood sugar?  1-2 times/day    Fasting Blood glucose range (mg/dL)  19-417   Pt reports FBG was 119 mg/dL today. She reports readings before supper 75-105 mg/dL.    Have you had a dilated eye exam in the past 12 months?  No    Have you had a dental exam in the past 12 months?  No   no teeth   Are you checking your feet?  Yes    How many days per week are you checking your feet?  7      Dietary Intake   Breakfast  cereal  and little milk or eggs or oatmeal    Snack (morning)  chips    Lunch  fixed by care home - meat (chicken, pork) with rice, potatoes, corn, green beans, peas)    Dinner  fixed by care home - usually meat and vegetables    Snack (evening)  pudding, fruit    Beverage(s)  water, sugar free Kool aid, juice      Exercise   Exercise Type  Light (walking / raking leaves)    How many days per week to you exercise?  5    How many minutes per day do you exercise?  60    Total minutes per week of exercise  300      Patient Education   Previous Diabetes Education  No    Disease state   Definition of diabetes, type 1 and 2, and the diagnosis of diabetes     Nutrition management   Role of diet in the treatment of diabetes and the relationship between the three main macronutrients and blood glucose level    Physical activity and exercise   Role of exercise on diabetes management, blood pressure control and cardiac health.    Medications  Taught/reviewed insulin injection, site rotation, insulin storage and needle disposal.;Reviewed patients medication for diabetes, action, purpose, timing of dose and side effects.   Instructed patient on using pen for Novolin N insulin. She return demo x 2.    Monitoring  Taught/discussed recording of test results and interpretation of SMBG.;Identified appropriate SMBG and/or A1C goals.;Yearly dilated eye exam    Acute complications  Taught treatment of hypoglycemia - the 15 rule.    Chronic complications  Relationship between chronic complications and blood glucose control    Psychosocial adjustment  Identified and addressed patients feelings and concerns about diabetes      Individualized Goals (developed by patient)   Reducing Risk  Improve blood sugars Decrease medications Prevent diabetes complications Lose weight Lead a healthier lifestyle Become more fit     Outcomes   Expected Outcomes  Demonstrated interest in learning. Expect positive outcomes    Future DMSE  4-6 wks       Individualized Plan for Diabetes Self-Management Training:   Learning Objective:  Patient will have a greater understanding of diabetes self-management. Patient education plan is to attend individual and/or group sessions per assessed needs and concerns.   Plan:   Patient Instructions  Check blood sugars 2 x day before breakfast and before supper every day Bring blood sugar records to the next appointment or class Exercise: continue exercises per Physical Therapy for  60 minutes  5  days a week Eat 3 meals day, 1-2 snacks a day Space meals 4-6 hours apart Avoid sugar sweetened drinks (juices) unless treating a low blood  sugar Carry fast acting glucose (glucose tablets or soft mints) and a snack at all times Mix cloudy insulin before each injection (don't shake) Rotate injection sites Insulin pen in use can stay out of the refrigerator (insulin pens not in use stay in refrigerator) Hold pen in place for 5-10 seconds after injection Novolin N insulin 10 units before breakfast Novolin N insulin   5 units before supper Bring complete list of all medications at next appointment or class  Expected Outcomes:  Demonstrated interest in learning. Expect positive outcomes  Education material provided:  General Meal Planning Guidelines Simple Meal Plan Getting started pamphlet (BD) 4 mm pen needle and instructions (BD) Glucose tablets Symptoms, causes and treatments  of Hypoglycemia  If problems or questions, patient to contact team via:   Sharion Settler, RN, CCM, CDE [phone]  Future DSME appointment: 4-6 wks  April 09, 2019 for Diabetes Class 1

## 2019-03-20 ENCOUNTER — Encounter: Payer: Self-pay | Admitting: Emergency Medicine

## 2019-03-20 ENCOUNTER — Emergency Department
Admission: EM | Admit: 2019-03-20 | Discharge: 2019-03-20 | Disposition: A | Discharge status: Home / Self Care | Payer: Medicare Other | Attending: Student in an Organized Health Care Education/Training Program | Admitting: Student in an Organized Health Care Education/Training Program

## 2019-03-20 ENCOUNTER — Other Ambulatory Visit: Payer: Self-pay

## 2019-03-20 DIAGNOSIS — R42 Dizziness and giddiness: Secondary | ICD-10-CM | POA: Diagnosis not present

## 2019-03-20 DIAGNOSIS — Z79899 Other long term (current) drug therapy: Secondary | ICD-10-CM | POA: Insufficient documentation

## 2019-03-20 DIAGNOSIS — J449 Chronic obstructive pulmonary disease, unspecified: Secondary | ICD-10-CM | POA: Diagnosis not present

## 2019-03-20 DIAGNOSIS — E119 Type 2 diabetes mellitus without complications: Secondary | ICD-10-CM | POA: Diagnosis not present

## 2019-03-20 DIAGNOSIS — Z87891 Personal history of nicotine dependence: Secondary | ICD-10-CM | POA: Insufficient documentation

## 2019-03-20 DIAGNOSIS — N3001 Acute cystitis with hematuria: Secondary | ICD-10-CM | POA: Diagnosis not present

## 2019-03-20 LAB — CBC
HCT: 41 % (ref 36.0–46.0)
Hemoglobin: 14.6 g/dL (ref 12.0–15.0)
MCH: 32.4 pg (ref 26.0–34.0)
MCHC: 35.6 g/dL (ref 30.0–36.0)
MCV: 90.9 fL (ref 80.0–100.0)
PLATELETS: 227 10*3/uL (ref 150–400)
RBC: 4.51 MIL/uL (ref 3.87–5.11)
RDW: 12.7 % (ref 11.5–15.5)
WBC: 9.3 10*3/uL (ref 4.0–10.5)
nRBC: 0 % (ref 0.0–0.2)

## 2019-03-20 LAB — BASIC METABOLIC PANEL
ANION GAP: 8 (ref 5–15)
BUN: 10 mg/dL (ref 6–20)
CALCIUM: 9.6 mg/dL (ref 8.9–10.3)
CO2: 20 mmol/L — ABNORMAL LOW (ref 22–32)
Chloride: 108 mmol/L (ref 98–111)
Creatinine, Ser: 0.55 mg/dL (ref 0.44–1.00)
GFR calc Af Amer: >60 mL/min (ref >60)
GLUCOSE: 152 mg/dL — AB (ref 70–99)
Potassium: 3.8 mmol/L (ref 3.5–5.1)
Sodium: 136 mmol/L (ref 135–145)

## 2019-03-20 LAB — URINALYSIS, COMPLETE (UACMP) WITH MICROSCOPIC
Bilirubin Urine: NEGATIVE
GLUCOSE, UA: NEGATIVE mg/dL
Ketones, ur: NEGATIVE mg/dL
Nitrite: NEGATIVE
PH: 5 (ref 5.0–8.0)
Protein, ur: NEGATIVE mg/dL
Specific Gravity, Urine: 1.018 (ref 1.005–1.030)

## 2019-03-20 LAB — GLUCOSE, CAPILLARY: Glucose-Capillary: 149 mg/dL — ABNORMAL HIGH (ref 70–99)

## 2019-03-20 LAB — PREGNANCY, URINE: Preg Test, Ur: NEGATIVE

## 2019-03-20 MED ORDER — CEPHALEXIN 500 MG PO CAPS: 500.0000 mg | ORAL_CAPSULE | Freq: Three times a day (TID) | ORAL | 0 refills | Status: AC

## 2019-03-20 MED ORDER — CEPHALEXIN 500 MG PO CAPS
500.0000 mg | ORAL_CAPSULE | Freq: Once | ORAL | Status: AC
  Administered 2019-03-20: 500 mg via ORAL
  Filled 2019-03-20: qty 1

## 2019-03-20 NOTE — ED Notes (Signed)
Pt has been given a box lunch tray and oral fluids. We will continue to monitor the pt.

## 2019-03-20 NOTE — ED Notes (Signed)
Pt here from [facility] with c/o dizziness and hyperglycemia per Peer Support Specialist. Business card is on pt stickers and she wants to be called prior to pt d/c.

## 2019-03-20 NOTE — ED Provider Notes (Signed)
[facility] Emergency Department Provider Note    First MD Initiated Contact with Patient 03/20/19 1049     (approximate)  I have reviewed the triage vital signs and the nursing notes.   HISTORY  Chief Complaint Dizziness    HPI SOLIANA KITKO is a 43 y.o. female close past medical history presents the ER for lightheadedness and dizziness.  States she has been urinating more frequently.  Denies any numbness or tingling.  No headaches.  States the symptoms started a day or 2 ago.  Able to ambulate with a steady gait but feels like she needs to slow down.  Has not had any measured fevers.  No cough or shortness of breath.  No nausea or vomiting.  Denies any diarrhea.  Patient was given water and crackers upon arrival to the ER by her legal guardian and states that she is feeling much better after eating.    Past Medical History:  Diagnosis Date  . Cholelithiasis   . Chronic back pain   . Collapsed lung   . COPD (chronic obstructive pulmonary disease) (HCC)   . Diabetes mellitus without complication (HCC)   . GERD (gastroesophageal reflux disease)   . Headache   . 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 HIV disease (HCC)   . Hypercholesterolemia   . Hypothyroidism   . Memory difficulties   . PML (progressive multifocal leukoencephalopathy) (HCC)   . Recurrent UTI   . Seizures (HCC)    Family History  Problem Relation Age of Onset  . Diabetes Mother   . Diabetes Father    Past Surgical History:  Procedure Laterality Date  . CHOLECYSTECTOMY N/A 01/25/2017   Procedure: LAPAROSCOPIC CHOLECYSTECTOMY WITH INTRAOPERATIVE CHOLANGIOGRAM;  Surgeon: Violeta Gelinas, MD;  Location: [facility] OR;  Service: General;  Laterality: N/A;  . LUNG SURGERY     There are no active problems to display for this patient.     Prior to Admission medications   Medication Sig Start Date End Date Taking? Authorizing Provider   acetaminophen (TYLENOL) 325 MG tablet Take 650 mg by mouth every 6 (six) hours as needed for moderate pain or headache.  01/02/14   [provider]  albuterol (PROVENTIL HFA;VENTOLIN HFA) 108 (90 Base) MCG/ACT inhaler Inhale 2 puffs into the lungs 4 (four) times daily as needed for wheezing or shortness of breath.  04/21/13   [provider]  benztropine (COGENTIN) 1 MG tablet Take 1 mg by mouth daily.     [provider]  buPROPion (WELLBUTRIN XL) 300 MG 24 hr tablet Take 300 mg by mouth daily.     [provider]  cetirizine (ZYRTEC) 10 MG tablet Take 10 mg by mouth daily.     [provider]  cyclobenzaprine (FLEXERIL) 10 MG tablet Take 1 tablet (10 mg total) by mouth 3 (three) times daily as needed. Patient taking differently: Take 10 mg by mouth 3 (three) times daily as needed for muscle spasms.  01/21/17   Joni Reining, PA-C  docusate sodium (STOOL SOFTENER) 100 MG capsule Take 100 mg by mouth 2 (two) times daily.  01/02/14   [provider]  elvitegravir-cobicistat-emtricitabine-tenofovir (STRIBILD) 150-150-200-300 MG TABS tablet Take 1 tablet by mouth daily with breakfast.  03/26/16   [provider]  ezetimibe (ZETIA) 10 MG tablet Take 10 mg by mouth daily.    [provider]  glucose blood (PRECISION QID TEST) test strip by Other  route.    [provider]  hydrocortisone 2.5 % cream Apply 1 application topically daily as needed.     [provider]  hydrOXYzine (ATARAX/VISTARIL) 25 MG tablet Take 25-50 mg by mouth See admin instructions. Take 25 mg by mouth in the morning and take 50 mg by mouth at bedtime 04/22/15   [provider]  ibuprofen (ADVIL,MOTRIN) 800 MG tablet Take 1 tablet (800 mg total) by mouth every 8 (eight) hours as needed for moderate pain. Patient taking differently: Take 600 mg by mouth every 6 (six) hours as needed for moderate pain.  07/09/15   Joni Reining, PA-C  insulin  NPH Human (HUMULIN N,NOVOLIN N) 100 UNIT/ML injection Inject 5-10 Units into the skin See admin instructions. Inject 10 units SQ in the morning and inject 5 units SQ before supper    [provider]  levETIRAcetam (KEPPRA) 500 MG tablet Take 1,000 mg by mouth 2 (two) times daily.  06/19/18   [provider]  Karlene Einstein 72 MCG capsule  11/24/18   [provider]  LORazepam (ATIVAN) 0.5 MG tablet Take 0.5 mg by mouth every 6 (six) hours as needed (for agitation).     [provider]  medroxyPROGESTERone (DEPO-PROVERA) 150 MG/ML injection Inject 150 mg into the muscle every 3 (three) months.  07/27/15   [provider]  miconazole (MICONAZOLE 7) 100 MG vaginal suppository Place 100 mg vaginally at bedtime as needed.    [provider]  Multiple Vitamins-Minerals (MULTIVITAMIN PO) Take 1 tablet by mouth daily.    [provider]  nitrofurantoin (MACRODANTIN) 100 MG capsule  06/19/18   [provider]  omeprazole (PRILOSEC) 20 MG capsule Take 40 mg by mouth 2 (two) times daily.     [provider]  ondansetron (ZOFRAN) 4 MG tablet Take 4 mg by mouth 3 (three) times daily as needed for nausea or vomiting.    [provider]  polyethylene glycol (MIRALAX / GLYCOLAX) packet Take 17 g by mouth daily as needed for moderate constipation.     [provider]  Probiotic Product (PROBIOTIC COLON SUPPORT PO) Take 1 capsule by mouth daily.    [provider]  risperiDONE (RISPERDAL) 2 MG tablet Take 2 mg by mouth 2 (two) times daily.     [provider]  rosuvastatin (CRESTOR) 10 MG tablet  11/19/18   [provider]  umeclidinium bromide (INCRUSE ELLIPTA) 62.5 MCG/INH AEPB Inhale 1 puff into the lungs daily.    [provider]  varenicline (CHANTIX) 1 MG tablet Take 1 mg by mouth 2 (two) times daily.     [provider]    Allergies Penicillins    Social History Social  History   Tobacco Use  . Smoking status: Former Smoker    Packs/day: 1.00    Years: 22.00    Pack years: 22.00    Types: Cigarettes    Last attempt to quit: 12/31/2016    Years since quitting: 2.2  . Smokeless tobacco: Never Used  Substance Use Topics  . Alcohol use: No  . Drug use: Yes    Types: Marijuana    Review of Systems Patient denies headaches, rhinorrhea, blurry vision, numbness, shortness of breath, chest pain, edema, cough, abdominal pain, nausea, vomiting, diarrhea, dysuria, fevers, rashes or hallucinations unless otherwise stated above in HPI. ____________________________________________   PHYSICAL EXAM:  VITAL SIGNS: Vitals:   03/20/19 1114 03/20/19 1121  BP: 120/89   Pulse: 90 95  Resp: 17 (!) 26  Temp:    SpO2: 95% 95%    Constitutional: Alert and oriented.  Eyes: Conjunctivae are normal.  Head: Atraumatic. Nose: No congestion/rhinnorhea. Mouth/Throat: Mucous membranes are moist.   Neck: No stridor. Painless ROM.  Cardiovascular: Normal rate, regular rhythm. Grossly normal heart sounds.  Good peripheral circulation. Respiratory: Normal respiratory effort.  No retractions. Lungs CTAB. Gastrointestinal: Soft and nontender. No distention. No abdominal bruits. No CVA tenderness. Genitourinary:  Musculoskeletal: No lower extremity tenderness nor edema.  No joint effusions. Neurologic:  Normal speech and language. No gross focal neurologic deficits are appreciated. No facial droop Skin:  Skin is warm, dry and intact. No rash noted. Psychiatric: Mood and affect are normal. Speech and behavior are normal.  ____________________________________________   LABS (all labs ordered are listed, but only abnormal results are displayed)  Results for orders placed or performed during the hospital encounter of 03/20/19 (from the past 24 hour(s))  Basic metabolic panel     Status: Abnormal   Collection Time: 03/20/19 10:14 AM  Result Value Ref Range   Sodium 136 135  - 145 mmol/L   Potassium 3.8 3.5 - 5.1 mmol/L   Chloride 108 98 - 111 mmol/L   CO2 20 (L) 22 - 32 mmol/L   Glucose, Bld 152 (H) 70 - 99 mg/dL   BUN 10 6 - 20 mg/dL   Creatinine, Ser 3.00 0.44 - 1.00 mg/dL   Calcium 9.6 8.9 - 92.3 mg/dL   GFR calc non Af Amer >60 >60 mL/min   GFR calc Af Amer >60 >60 mL/min   Anion gap 8 5 - 15  CBC     Status: None   Collection Time: 03/20/19 10:14 AM  Result Value Ref Range   WBC 9.3 4.0 - 10.5 K/uL   RBC 4.51 3.87 - 5.11 MIL/uL   Hemoglobin 14.6 12.0 - 15.0 g/dL   HCT 30.0 76.2 - 26.3 %   MCV 90.9 80.0 - 100.0 fL   MCH 32.4 26.0 - 34.0 pg   MCHC 35.6 30.0 - 36.0 g/dL   RDW 33.5 45.6 - 25.6 %   Platelets 227 150 - 400 K/uL   nRBC 0.0 0.0 - 0.2 %  Urinalysis, Complete w Microscopic     Status: Abnormal   Collection Time: 03/20/19 10:14 AM  Result Value Ref Range   Color, Urine YELLOW (A) YELLOW   APPearance HAZY (A) CLEAR   Specific Gravity, Urine 1.018 1.005 - 1.030   pH 5.0 5.0 - 8.0   Glucose, UA NEGATIVE NEGATIVE mg/dL   Hgb urine dipstick MODERATE (A) NEGATIVE   Bilirubin Urine NEGATIVE NEGATIVE   Ketones, ur NEGATIVE NEGATIVE mg/dL   Protein, ur NEGATIVE NEGATIVE mg/dL   Nitrite NEGATIVE NEGATIVE   Leukocytes,Ua TRACE (A) NEGATIVE   RBC / HPF 0-5 0 - 5 RBC/hpf   WBC, UA 11-20 0 - 5 WBC/hpf   Bacteria, UA MANY (A) NONE SEEN   Squamous Epithelial / LPF 11-20 0 - 5   Mucus PRESENT   Pregnancy, urine     Status: None   Collection Time: 03/20/19 10:14 AM  Result Value Ref Range   Preg Test, Ur NEGATIVE NEGATIVE  Glucose, capillary     Status: Abnormal   Collection Time: 03/20/19 10:21 AM  Result Value Ref Range   Glucose-Capillary 149 (H) 70 - 99 mg/dL   ____________________________________________  EKG My review and personal interpretation at Time: 10:11   Indication: dizziness  Rate: 95  Rhythm:  sinus Axis: normal Other: normal intervals, no stemi ____________________________________________  ____________________________________________   PROCEDURES  Procedure(s) performed:  Procedures    Critical Care performed: no ____________________________________________   INITIAL IMPRESSION / ASSESSMENT AND PLAN / ED COURSE  Pertinent labs & imaging results that were available during my care of the patient were reviewed by me and considered in my medical decision making (see chart for details).   DDX: deHydration, anemia vertigo, CVA, electrolyte abnormality, UTI,  Judeth CornfieldJennifer R Desautel is a 43 y.o. who presents to the ED with symptoms as described above that improved after eating.  No evidence of hypoglycemia.  Electrolytes are reassuring.  No evidence of anemia.  No focal neuro deficits to suggest CVA.  Do not feel that emergent CT imaging of the head is indicated.  Does have evidence of cystitis and given her increased urinary frequency C will treat with Keflex.  She is tolerating oral hydration.  She is otherwise hemodynamically stable and appropriate for discharge home.      As part of my medical decision making, I reviewed the following data within the electronic MEDICAL RECORD NUMBER Nursing notes reviewed and incorporated, Labs reviewed, notes from prior ED visits.  ____________________________________________   FINAL CLINICAL IMPRESSION(S) / ED DIAGNOSES  Final diagnoses:  Dizziness  Acute cystitis with hematuria      NEW MEDICATIONS STARTED DURING THIS VISIT:  New Prescriptions   No medications on file     Note:  This document was prepared using Dragon voice recognition software and may include unintentional dictation errors.    Willy Eddyobinson, Joslin Doell, MD 03/20/19 1135

## 2019-03-20 NOTE — ED Notes (Signed)
Pt is being discharged to home with caregiver. Aox4, VSS, pt does not c/o any pain or dizziness at this time. AVS/RX was given and explained to the pt/caregiver and they verbalized understanding of all information.

## 2019-03-20 NOTE — ED Triage Notes (Addendum)
Patient presents to the ED with dizziness since yesterday.  Patient lives in an independent apartment and goes to the "country club" day program in [place].  Patient states she is her own guardian.  Patient states her caseworker brought her to the ER and left "to go pick up somebody else."  Patient states she thought her blood sugar might have caused her dizziness but her blood sugar was her normal at 155.

## 2019-03-26 ENCOUNTER — Ambulatory Visit: Payer: Medicare Other | Admitting: Podiatry

## 2019-04-09 ENCOUNTER — Encounter: Payer: Medicare Other | Attending: Family Medicine | Admitting: Dietician

## 2019-04-09 ENCOUNTER — Ambulatory Visit: Payer: Medicare Other | Admitting: Podiatry

## 2019-04-09 ENCOUNTER — Other Ambulatory Visit: Payer: Self-pay

## 2019-04-09 VITALS — Ht 66.0 in | Wt 231.4 lb

## 2019-04-09 DIAGNOSIS — Z794 Long term (current) use of insulin: Secondary | ICD-10-CM

## 2019-04-09 DIAGNOSIS — E119 Type 2 diabetes mellitus without complications: Secondary | ICD-10-CM | POA: Insufficient documentation

## 2019-04-09 DIAGNOSIS — Z6837 Body mass index (BMI) 37.0-37.9, adult: Secondary | ICD-10-CM | POA: Insufficient documentation

## 2019-04-09 DIAGNOSIS — Z713 Dietary counseling and surveillance: Secondary | ICD-10-CM | POA: Insufficient documentation

## 2019-04-09 NOTE — Progress Notes (Signed)

## 2019-04-16 ENCOUNTER — Other Ambulatory Visit: Payer: Self-pay

## 2019-04-16 ENCOUNTER — Encounter: Payer: Medicare Other | Admitting: *Deleted

## 2019-04-16 ENCOUNTER — Encounter: Payer: Self-pay | Admitting: *Deleted

## 2019-04-16 VITALS — Wt 231.1 lb

## 2019-04-16 DIAGNOSIS — Z794 Long term (current) use of insulin: Principal | ICD-10-CM

## 2019-04-16 DIAGNOSIS — E119 Type 2 diabetes mellitus without complications: Secondary | ICD-10-CM

## 2019-04-16 DIAGNOSIS — Z713 Dietary counseling and surveillance: Secondary | ICD-10-CM | POA: Diagnosis not present

## 2019-04-16 NOTE — Progress Notes (Signed)

## 2019-04-23 ENCOUNTER — Encounter: Payer: Self-pay | Admitting: Dietician

## 2019-04-23 ENCOUNTER — Other Ambulatory Visit: Payer: Self-pay

## 2019-04-23 ENCOUNTER — Encounter: Payer: Medicare Other | Admitting: Dietician

## 2019-04-23 VITALS — BP 114/80 | Ht 66.0 in | Wt 227.5 lb

## 2019-04-23 DIAGNOSIS — E119 Type 2 diabetes mellitus without complications: Secondary | ICD-10-CM

## 2019-04-23 DIAGNOSIS — Z794 Long term (current) use of insulin: Principal | ICD-10-CM

## 2019-04-23 DIAGNOSIS — Z713 Dietary counseling and surveillance: Secondary | ICD-10-CM | POA: Diagnosis not present

## 2019-04-23 NOTE — Progress Notes (Signed)

## 2019-04-24 ENCOUNTER — Encounter: Payer: Self-pay | Admitting: *Deleted

## 2019-05-07 ENCOUNTER — Ambulatory Visit: Payer: Medicare Other | Admitting: Podiatry

## 2019-05-07 ENCOUNTER — Encounter: Payer: Self-pay | Admitting: Podiatry

## 2019-05-07 ENCOUNTER — Other Ambulatory Visit: Payer: Self-pay

## 2019-05-07 ENCOUNTER — Ambulatory Visit (INDEPENDENT_AMBULATORY_CARE_PROVIDER_SITE_OTHER): Payer: Medicare Other | Admitting: Podiatry

## 2019-05-07 VITALS — Temp 97.8°F

## 2019-05-07 DIAGNOSIS — B351 Tinea unguium: Secondary | ICD-10-CM | POA: Diagnosis not present

## 2019-05-07 DIAGNOSIS — E119 Type 2 diabetes mellitus without complications: Secondary | ICD-10-CM

## 2019-05-07 DIAGNOSIS — M79609 Pain in unspecified limb: Principal | ICD-10-CM

## 2019-05-07 DIAGNOSIS — M79676 Pain in unspecified toe(s): Secondary | ICD-10-CM | POA: Diagnosis not present

## 2019-05-07 NOTE — Progress Notes (Signed)
Complaint:  Visit Type: Patient returns to my office for continued preventative foot care services. Complaint: Patient states" my nails have grown long and thick and become painful to walk and wear shoes" Patient has been diagnosed with DM with no foot complications. The patient presents for preventative foot care services. No changes to ROS.  Marland Kitchen Podiatric Exam: Vascular: dorsalis pedis and posterior tibial pulses are palpable bilateral. Capillary return is immediate. Temperature gradient is WNL. Skin turgor WNL  Sensorium: Normal Semmes Weinstein monofilament test. Normal tactile sensation bilaterally. Nail Exam: Pt has thick disfigured discolored nails with subungual debris noted bilateral entire nail hallux through fifth toenails.  The left hallux toenail has self avulsed with no evidence of any redness, swelling or drainage Ulcer Exam: There is no evidence of ulcer or pre-ulcerative changes or infection. Orthopedic Exam: Muscle tone and strength are WNL. No limitations in general ROM. No crepitus or effusions noted. Foot type and digits show no abnormalities. HAV  B/L. Skin: No Porokeratosis. No infection or ulcers.  Keratoderma climacticum  B/L  Diagnosis:  Onychomycosis, , Pain in right toe, pain in left toes  Treatment & Plan Procedures and Treatment: Consent by patient was obtained for treatment procedures. The patient understood the discussion of treatment and procedures well. All questions were answered thoroughly reviewed. Debridement of mycotic and hypertrophic toenails, 1 through 5 bilateral and clearing of subungual debris. No ulceration, no infection noted.  Return Visit-Office Procedure: Patient instructed to return to the office for a follow up visit 3 months for continued evaluation and treatment.    Helane Gunther DPM

## 2019-08-06 ENCOUNTER — Other Ambulatory Visit: Payer: Self-pay

## 2019-08-06 ENCOUNTER — Encounter: Payer: Self-pay | Admitting: Podiatry

## 2019-08-06 ENCOUNTER — Ambulatory Visit (INDEPENDENT_AMBULATORY_CARE_PROVIDER_SITE_OTHER): Payer: Medicare Other | Admitting: Podiatry

## 2019-08-06 VITALS — Temp 97.2°F

## 2019-08-06 DIAGNOSIS — M79676 Pain in unspecified toe(s): Secondary | ICD-10-CM

## 2019-08-06 DIAGNOSIS — B351 Tinea unguium: Secondary | ICD-10-CM | POA: Diagnosis not present

## 2019-08-06 DIAGNOSIS — M79609 Pain in unspecified limb: Secondary | ICD-10-CM

## 2019-08-06 DIAGNOSIS — E119 Type 2 diabetes mellitus without complications: Secondary | ICD-10-CM | POA: Diagnosis not present

## 2019-08-06 NOTE — Progress Notes (Signed)
Complaint:  Visit Type: Patient returns to my office for continued preventative foot care services. Complaint: Patient states" my nails have grown long and thick and become painful to walk and wear shoes" Patient has been diagnosed with DM with no foot complications. The patient presents for preventative foot care services. No changes to ROS.  Marland Kitchen Podiatric Exam: Vascular: dorsalis pedis and posterior tibial pulses are palpable bilateral. Capillary return is immediate. Temperature gradient is WNL. Skin turgor WNL  Sensorium: Normal Semmes Weinstein monofilament test. Normal tactile sensation bilaterally. Nail Exam: Pt has thick disfigured discolored nails with subungual debris noted bilateral entire nail hallux through fifth toenails.  The left hallux toenail has self avulsed with no evidence of any redness, swelling or drainage Ulcer Exam: There is no evidence of ulcer or pre-ulcerative changes or infection. Orthopedic Exam: Muscle tone and strength are WNL. No limitations in general ROM. No crepitus or effusions noted. Foot type and digits show no abnormalities. HAV  B/L. Skin: No Porokeratosis. No infection or ulcers.    Diagnosis:  Onychomycosis, , Pain in right toe, pain in left toes  Treatment & Plan Procedures and Treatment: Consent by patient was obtained for treatment procedures. The patient understood the discussion of treatment and procedures well. All questions were answered thoroughly reviewed. Debridement of mycotic and hypertrophic toenails, 1 through 5 bilateral and clearing of subungual debris. No ulceration, no infection noted.  Return Visit-Office Procedure: Patient instructed to return to the office for a follow up visit 3 months for continued evaluation and treatment.    Gardiner Barefoot DPM

## 2019-11-05 ENCOUNTER — Encounter: Payer: Self-pay | Admitting: Podiatry

## 2019-11-05 ENCOUNTER — Ambulatory Visit (INDEPENDENT_AMBULATORY_CARE_PROVIDER_SITE_OTHER): Payer: Medicare Other | Admitting: Podiatry

## 2019-11-05 ENCOUNTER — Other Ambulatory Visit: Payer: Self-pay

## 2019-11-05 DIAGNOSIS — B351 Tinea unguium: Secondary | ICD-10-CM

## 2019-11-05 DIAGNOSIS — E119 Type 2 diabetes mellitus without complications: Secondary | ICD-10-CM

## 2019-11-05 DIAGNOSIS — M79676 Pain in unspecified toe(s): Secondary | ICD-10-CM | POA: Diagnosis not present

## 2019-11-05 NOTE — Progress Notes (Signed)
Complaint:  Visit Type: Patient returns to my office for continued preventative foot care services. Complaint: Patient states" my nails have grown long and thick and become painful to walk and wear shoes" Patient has been diagnosed with DM with no foot complications. The patient presents for preventative foot care services. No changes to ROS.  Marland Kitchen Podiatric Exam: Vascular: dorsalis pedis and posterior tibial pulses are palpable bilateral. Capillary return is immediate. Temperature gradient is WNL. Skin turgor WNL  Sensorium: Normal Semmes Weinstein monofilament test. Normal tactile sensation bilaterally. Nail Exam: Pt has thick disfigured discolored nails with subungual debris noted bilateral entire nail hallux through fifth toenails.   Ulcer Exam: There is no evidence of ulcer or pre-ulcerative changes or infection. Orthopedic Exam: Muscle tone and strength are WNL. No limitations in general ROM. No crepitus or effusions noted. Foot type and digits show no abnormalities. HAV  B/L. Skin: No Porokeratosis. No infection or ulcers.  Keratoderma climacticum  B/L  Diagnosis:  Onychomycosis, , Pain in right toe, pain in left toes  Treatment & Plan Procedures and Treatment: Consent by patient was obtained for treatment procedures. The patient understood the discussion of treatment and procedures well. All questions were answered thoroughly reviewed. Debridement of mycotic and hypertrophic toenails, 1 through 5 bilateral and clearing of subungual debris. No ulceration, no infection noted.  Return Visit-Office Procedure: Patient instructed to return to the office for a follow up visit 3 months for continued evaluation and treatment.    Gardiner Barefoot DPM

## 2019-11-25 ENCOUNTER — Ambulatory Visit: Admit: 2019-11-25 | Payer: Medicare Other | Admitting: Internal Medicine

## 2019-11-25 SURGERY — COLONOSCOPY WITH PROPOFOL
Anesthesia: General

## 2019-12-04 ENCOUNTER — Other Ambulatory Visit
Admission: RE | Admit: 2019-12-04 | Discharge: 2019-12-04 | Disposition: A | Discharge status: Home / Self Care | Payer: Medicare Other | Source: Ambulatory Visit | Attending: General Surgery | Admitting: General Surgery

## 2019-12-04 DIAGNOSIS — Z01812 Encounter for preprocedural laboratory examination: Secondary | ICD-10-CM | POA: Insufficient documentation

## 2019-12-04 DIAGNOSIS — Z20828 Contact with and (suspected) exposure to other viral communicable diseases: Secondary | ICD-10-CM | POA: Diagnosis not present

## 2019-12-04 LAB — SARS CORONAVIRUS 2 (TAT 6-24 HRS): SARS Coronavirus 2: NEGATIVE

## 2019-12-09 ENCOUNTER — Encounter: Admission: RE | Payer: Self-pay | Source: Home / Self Care

## 2019-12-09 ENCOUNTER — Ambulatory Visit: Admission: RE | Admit: 2019-12-09 | Payer: Medicare Other | Source: Home / Self Care | Admitting: General Surgery

## 2019-12-09 ENCOUNTER — Encounter: Payer: Self-pay | Admitting: Anesthesiology

## 2019-12-09 SURGERY — COLONOSCOPY WITH PROPOFOL
Anesthesia: General

## 2020-01-05 ENCOUNTER — Other Ambulatory Visit: Payer: Self-pay

## 2020-01-05 ENCOUNTER — Other Ambulatory Visit
Admission: RE | Admit: 2020-01-05 | Discharge: 2020-01-05 | Disposition: A | Discharge status: Home / Self Care | Payer: Medicare Other | Source: Ambulatory Visit | Attending: General Surgery | Admitting: General Surgery

## 2020-01-05 DIAGNOSIS — Z20822 Contact with and (suspected) exposure to covid-19: Secondary | ICD-10-CM | POA: Diagnosis not present

## 2020-01-05 DIAGNOSIS — Z01812 Encounter for preprocedural laboratory examination: Secondary | ICD-10-CM | POA: Insufficient documentation

## 2020-01-06 LAB — SARS CORONAVIRUS 2 (TAT 6-24 HRS): SARS Coronavirus 2: NEGATIVE

## 2020-01-07 ENCOUNTER — Encounter: Payer: Self-pay | Admitting: General Surgery

## 2020-01-08 ENCOUNTER — Encounter
Admission: RE | Disposition: A | Discharge status: Home / Self Care | Payer: Self-pay | Source: Home / Self Care | Attending: General Surgery

## 2020-01-08 ENCOUNTER — Other Ambulatory Visit: Payer: Self-pay

## 2020-01-08 ENCOUNTER — Encounter: Payer: Self-pay | Admitting: Certified Registered"

## 2020-01-08 ENCOUNTER — Ambulatory Visit
Admission: RE | Admit: 2020-01-08 | Discharge: 2020-01-08 | Disposition: A | Discharge status: Home / Self Care | Payer: Medicare Other | Attending: General Surgery | Admitting: General Surgery

## 2020-01-08 ENCOUNTER — Encounter: Payer: Self-pay | Admitting: *Deleted

## 2020-01-08 DIAGNOSIS — Z538 Procedure and treatment not carried out for other reasons: Secondary | ICD-10-CM | POA: Insufficient documentation

## 2020-01-08 DIAGNOSIS — R197 Diarrhea, unspecified: Secondary | ICD-10-CM | POA: Diagnosis not present

## 2020-01-08 DIAGNOSIS — K219 Gastro-esophageal reflux disease without esophagitis: Secondary | ICD-10-CM | POA: Diagnosis not present

## 2020-01-08 DIAGNOSIS — R194 Change in bowel habit: Secondary | ICD-10-CM | POA: Insufficient documentation

## 2020-01-08 DIAGNOSIS — R1110 Vomiting, unspecified: Secondary | ICD-10-CM | POA: Insufficient documentation

## 2020-01-08 HISTORY — PX: COLONOSCOPY WITH PROPOFOL: SHX5780

## 2020-01-08 HISTORY — PX: ESOPHAGOGASTRODUODENOSCOPY (EGD) WITH PROPOFOL: SHX5813

## 2020-01-08 SURGERY — COLONOSCOPY WITH PROPOFOL
Anesthesia: General

## 2020-01-08 MED ORDER — SODIUM CHLORIDE 0.9 % IV SOLN: INTRAVENOUS | Status: DC

## 2020-01-08 MED ORDER — PROPOFOL 500 MG/50ML IV EMUL
INTRAVENOUS | Status: AC
  Filled 2020-01-08: qty 50

## 2020-01-08 NOTE — OR Nursing (Signed)
Kristen Villegas did not understand preop instructions.  She took dulcolax pills and drank gaterade but did not mix the miralax with it.  Was given a choice to have upper today and colon at a later date or just do both at a later date.  Chose to do both later.  Talked to her case worker who said she would bring her at a later date.  Kristen Villegas called her case worker Kristen Villegas to ask her for a ride home today and she refused.  Kristen Villegas then found a neighbor to pick her up.  Very unsure if she will be back due to ride issue

## 2020-02-04 ENCOUNTER — Ambulatory Visit (INDEPENDENT_AMBULATORY_CARE_PROVIDER_SITE_OTHER): Payer: Medicare Other | Admitting: Podiatry

## 2020-02-04 ENCOUNTER — Encounter: Payer: Self-pay | Admitting: Podiatry

## 2020-02-04 ENCOUNTER — Other Ambulatory Visit: Payer: Self-pay

## 2020-02-04 DIAGNOSIS — B351 Tinea unguium: Secondary | ICD-10-CM

## 2020-02-04 DIAGNOSIS — M79609 Pain in unspecified limb: Secondary | ICD-10-CM

## 2020-02-04 DIAGNOSIS — E119 Type 2 diabetes mellitus without complications: Secondary | ICD-10-CM | POA: Diagnosis not present

## 2020-02-04 NOTE — Progress Notes (Signed)
Complaint:  Visit Type: Patient returns to my office for continued preventative foot care services. Complaint: Patient states" my nails have grown long and thick and become painful to walk and wear shoes" Patient has been diagnosed with DM with no foot complications. The patient presents for preventative foot care services. No changes to ROS.  Marland Kitchen Podiatric Exam: Vascular: dorsalis pedis and posterior tibial pulses are palpable bilateral. Capillary return is immediate. Temperature gradient is WNL. Skin turgor WNL  Sensorium: Normal Semmes Weinstein monofilament test. Normal tactile sensation bilaterally. Nail Exam: Pt has thick disfigured discolored nails with subungual debris noted bilateral entire nail hallux through fifth toenails.   Ulcer Exam: There is no evidence of ulcer or pre-ulcerative changes or infection. Orthopedic Exam: Muscle tone and strength are WNL. No limitations in general ROM. No crepitus or effusions noted. Foot type and digits show no abnormalities. HAV  B/L. Skin: No Porokeratosis. No infection or ulcers.  Keratoderma climacticum  B/L  Diagnosis:  Onychomycosis, , Pain in right toe, pain in left toes  Treatment & Plan Procedures and Treatment: Consent by patient was obtained for treatment procedures. The patient understood the discussion of treatment and procedures well. All questions were answered thoroughly reviewed. Debridement of mycotic and hypertrophic toenails, 1 through 5 bilateral and clearing of subungual debris. No ulceration, no infection noted.  Return Visit-Office Procedure: Patient instructed to return to the office for a follow up visit 4 months for continued evaluation and treatment.    Helane Gunther DPM

## 2020-06-03 ENCOUNTER — Encounter: Payer: Self-pay | Admitting: *Deleted

## 2020-06-03 DIAGNOSIS — R569 Unspecified convulsions: Secondary | ICD-10-CM | POA: Insufficient documentation

## 2020-06-06 ENCOUNTER — Other Ambulatory Visit: Payer: Self-pay

## 2020-06-06 ENCOUNTER — Ambulatory Visit (INDEPENDENT_AMBULATORY_CARE_PROVIDER_SITE_OTHER): Payer: Medicare Other | Admitting: Podiatry

## 2020-06-06 ENCOUNTER — Encounter: Payer: Self-pay | Admitting: Podiatry

## 2020-06-06 VITALS — Temp 97.5°F

## 2020-06-06 DIAGNOSIS — B351 Tinea unguium: Secondary | ICD-10-CM | POA: Diagnosis not present

## 2020-06-06 DIAGNOSIS — E119 Type 2 diabetes mellitus without complications: Secondary | ICD-10-CM

## 2020-06-06 DIAGNOSIS — M79676 Pain in unspecified toe(s): Secondary | ICD-10-CM

## 2020-06-06 NOTE — Progress Notes (Signed)
This patient returns to my office for at risk foot care.  This patient requires this care by a professional since this patient will be at risk due to having diabetes with no complications. This patient is unable to cut nails himself since the patient cannot reach his nails.These nails are painful walking and wearing shoes.  This patient presents for at risk foot care today.  General Appearance  Alert, conversant and in no acute stress.  Vascular  Dorsalis pedis and posterior tibial  pulses are palpable  bilaterally.  Capillary return is within normal limits  bilaterally. Temperature is within normal limits  bilaterally.  Neurologic  Senn-Weinstein monofilament wire test within normal limits  bilaterally. Muscle power within normal limits bilaterally.  Nails Thick disfigured discolored nails with subungual debris  from hallux to fifth toes bilaterally. No evidence of bacterial infection or drainage bilaterally.  Orthopedic  No limitations of motion  feet .  No crepitus or effusions noted.  No bony pathology or digital deformities noted. HAV  B/L.  Skin  normotropic skin with no porokeratosis noted bilaterally.  No signs of infections or ulcers noted.     Onychomycosis  Pain in right toes  Pain in left toes  Consent was obtained for treatment procedures.   Mechanical debridement of nails 1-5  bilaterally performed with a nail nipper.  Filed with dremel without incident.    Return office visit  3 months                    Told patient to return for periodic foot care and evaluation due to potential at risk complications.   Helane Gunther DPM

## 2020-07-07 ENCOUNTER — Emergency Department: Payer: Medicare Other

## 2020-07-07 ENCOUNTER — Encounter: Payer: Self-pay | Admitting: Emergency Medicine

## 2020-07-07 ENCOUNTER — Emergency Department
Admission: EM | Admit: 2020-07-07 | Discharge: 2020-07-07 | Disposition: A | Discharge status: Home / Self Care | Payer: Medicare Other | Attending: Student in an Organized Health Care Education/Training Program | Admitting: Student in an Organized Health Care Education/Training Program

## 2020-07-07 ENCOUNTER — Other Ambulatory Visit: Payer: Self-pay

## 2020-07-07 DIAGNOSIS — E039 Hypothyroidism, unspecified: Secondary | ICD-10-CM | POA: Insufficient documentation

## 2020-07-07 DIAGNOSIS — W182XXA Fall in (into) shower or empty bathtub, initial encounter: Secondary | ICD-10-CM | POA: Diagnosis not present

## 2020-07-07 DIAGNOSIS — W19XXXA Unspecified fall, initial encounter: Secondary | ICD-10-CM

## 2020-07-07 DIAGNOSIS — M79601 Pain in right arm: Secondary | ICD-10-CM | POA: Diagnosis not present

## 2020-07-07 DIAGNOSIS — Y93E1 Activity, personal bathing and showering: Secondary | ICD-10-CM | POA: Diagnosis not present

## 2020-07-07 DIAGNOSIS — Z794 Long term (current) use of insulin: Secondary | ICD-10-CM | POA: Diagnosis not present

## 2020-07-07 DIAGNOSIS — F17210 Nicotine dependence, cigarettes, uncomplicated: Secondary | ICD-10-CM | POA: Diagnosis not present

## 2020-07-07 DIAGNOSIS — Y92002 Bathroom of unspecified non-institutional (private) residence single-family (private) house as the place of occurrence of the external cause: Secondary | ICD-10-CM | POA: Insufficient documentation

## 2020-07-07 DIAGNOSIS — M25511 Pain in right shoulder: Secondary | ICD-10-CM | POA: Diagnosis present

## 2020-07-07 DIAGNOSIS — S134XXA Sprain of ligaments of cervical spine, initial encounter: Secondary | ICD-10-CM | POA: Diagnosis not present

## 2020-07-07 DIAGNOSIS — T148XXA Other injury of unspecified body region, initial encounter: Secondary | ICD-10-CM | POA: Diagnosis not present

## 2020-07-07 DIAGNOSIS — J449 Chronic obstructive pulmonary disease, unspecified: Secondary | ICD-10-CM | POA: Diagnosis not present

## 2020-07-07 DIAGNOSIS — Z79899 Other long term (current) drug therapy: Secondary | ICD-10-CM | POA: Insufficient documentation

## 2020-07-07 DIAGNOSIS — T07XXXA Unspecified multiple injuries, initial encounter: Secondary | ICD-10-CM

## 2020-07-07 DIAGNOSIS — E119 Type 2 diabetes mellitus without complications: Secondary | ICD-10-CM | POA: Insufficient documentation

## 2020-07-07 DIAGNOSIS — Y999 Unspecified external cause status: Secondary | ICD-10-CM | POA: Insufficient documentation

## 2020-07-07 DIAGNOSIS — S139XXA Sprain of joints and ligaments of unspecified parts of neck, initial encounter: Secondary | ICD-10-CM

## 2020-07-07 MED ORDER — HYDROCODONE-ACETAMINOPHEN 5-325 MG PO TABS
1.0000 | ORAL_TABLET | Freq: Once | ORAL | Status: AC
  Administered 2020-07-07: 1 via ORAL
  Filled 2020-07-07: qty 1

## 2020-07-07 NOTE — Discharge Instructions (Signed)
Follow-up with [facility] orthopedics if not improving in 5 to 7 days.  Apply ice to all areas that hurt.  Return if worsening.

## 2020-07-07 NOTE — ED Provider Notes (Signed)
[facility] Emergency Department Provider Note  ____________________________________________   First MD Initiated Contact with Patient 07/07/20 1412     (approximate)  I have reviewed the triage vital signs and the nursing notes.   HISTORY  Chief Complaint Arm Injury    HPI Kristen Villegas is a 44 y.o. female presents to the emergency department via EMS complaining of right shoulder and arm pain.  Some neck pain.  Patient states she fell in the shower 2 days ago has continued to have pain.  She denies any LOC.  No numbness or tingling.  States pain is very bad.  Patient has history of HIV, diabetes, memory difficulties, seizures, etc. pain rated at 10/10   Past Medical History:  Diagnosis Date  . Cholelithiasis   . Chronic back pain   . Collapsed lung   . COPD (chronic obstructive pulmonary disease) (HCC)   . Diabetes mellitus without complication (HCC)   . GERD (gastroesophageal reflux disease)   . Headache   . 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 HIV disease (HCC)   . Hypercholesterolemia   . Hypothyroidism   . Memory difficulties   . PML (progressive multifocal leukoencephalopathy) (HCC)   . Recurrent UTI   . Seizures [facility](HCC)     Patient Active Problem List   Diagnosis Date Noted  . Seizures (HCC) 06/03/2020  . Smoker 02/07/2016  . Dysuria 09/20/2015  . Cervical dysplasia 06/15/2015  . Tinea capitis 08/31/2014  . Cocaine abuse (HCC) 09/08/2013  . 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 Multiphasic screening 04/24/2013  . Hyperlipidemia 04/23/2013  . TSH deficiency 04/23/2013  . Diabetes (HCC) 04/21/2013  . HIV disease (HCC) 04/21/2013    Past Surgical History:  Procedure Laterality Date  . CHOLECYSTECTOMY N/A 01/25/2017   Procedure: LAPAROSCOPIC CHOLECYSTECTOMY WITH INTRAOPERATIVE CHOLANGIOGRAM;  Surgeon: Kristen GelinasBurke Thompson, MD;  [facility];  Service: General;   Laterality: N/A;  . COLONOSCOPY WITH PROPOFOL N/A 01/08/2020   Procedure: COLONOSCOPY WITH PROPOFOL;  Surgeon: Kristen Villegas, Kristen W, MD;  Location: [facility] ENDOSCOPY;  Service: Endoscopy;  Laterality: N/A;  . ESOPHAGOGASTRODUODENOSCOPY (EGD) WITH PROPOFOL N/A 01/08/2020   Procedure: ESOPHAGOGASTRODUODENOSCOPY (EGD) WITH PROPOFOL;  Surgeon: Kristen Villegas, Kristen W, MD;  Location: [facility] ENDOSCOPY;  Service: Endoscopy;  Laterality: N/A;  . LUNG SURGERY      Prior to Admission medications   Medication Sig Start Date End Date Taking? Authorizing Provider  acetaminophen (TYLENOL) 325 MG tablet Take 650 mg by mouth every 6 (six) hours as needed for moderate pain or headache.  01/02/14   [provider]  albuterol (PROVENTIL HFA;VENTOLIN HFA) 108 (90 Base) MCG/ACT inhaler Inhale 2 puffs into the lungs 4 (four) times daily as needed for wheezing or shortness of breath.  04/21/13   [provider]  BD PEN NEEDLE NANO U/F 32G X 4 MM MISC  08/03/19   [provider]  benztropine (COGENTIN) 1 MG tablet Take 1 mg by mouth daily.     [provider]  buPROPion (WELLBUTRIN XL) 300 MG 24 hr tablet Take 300 mg by mouth daily.     [provider]  cetirizine (ZYRTEC) 10 MG tablet Take 10 mg by mouth daily.     [provider]  cyclobenzaprine (FLEXERIL) 10 MG tablet Take 1 tablet (10 mg total) by mouth 3 (three) times daily as needed. Patient taking differently: Take 10 mg by mouth 3 (three) times daily as needed for muscle spasms.  01/21/17   Kristen Reining, PA-C  dicyclomine (BENTYL) 10 MG capsule Take 10 mg by mouth 4 (four) times daily -  before meals and at bedtime.    [provider]  docusate sodium (STOOL SOFTENER) 100 MG capsule Take 100 mg by mouth 2 (two) times daily.  01/02/14   [provider]  elvitegravir-cobicistat-emtricitabine-tenofovir (GENVOYA) 150-150-200-10 MG TABS tablet Take by mouth. 04/11/20   [provider]    elvitegravir-cobicistat-emtricitabine-tenofovir (STRIBILD) 150-150-200-300 MG TABS tablet Take 1 tablet by mouth daily with breakfast.  03/26/16   [provider]  ezetimibe (ZETIA) 10 MG tablet Take 10 mg by mouth daily.    [provider]  FIASP FLEXTOUCH 100 UNIT/ML [facility]  05/07/19   [provider]  glucose blood (PRECISION QID TEST) test strip by Other route.    [provider]  hydrocortisone 2.5 % cream Apply 1 application topically daily as needed.     [provider]  hydrOXYzine (VISTARIL) 25 MG capsule  06/19/18   [provider]  ibuprofen (ADVIL,MOTRIN) 800 MG tablet Take 1 tablet (800 mg total) by mouth every 8 (eight) hours as needed for moderate pain. Patient taking differently: Take 600 mg by mouth every 6 (six) hours as needed for moderate pain.  07/09/15   Kristen Reining, PA-C  INGREZZA 40 MG CAPS  07/16/19   [provider]  insulin NPH Human (HUMULIN N,NOVOLIN N) 100 UNIT/ML injection Inject 5-10 Units into the skin See admin instructions. Inject 10 units SQ in the morning and inject 5 units SQ before supper    [provider]  ketorolac (TORADOL) 10 MG tablet  04/06/19   [provider]  Lactobacillus Rhamnosus, GG, (CULTURELLE) CAPS Take by mouth.    [provider]  levETIRAcetam (KEPPRA) 1000 MG tablet  07/16/19   [provider]  levETIRAcetam (KEPPRA) 500 MG tablet Take 1,000 mg by mouth 2 (two) times daily.  06/19/18   [provider]  Kristen Villegas 72 MCG capsule  11/24/18   [provider]  LORazepam (ATIVAN) 0.5 MG tablet Take 0.5 mg by mouth every 6 (six) hours as needed (for agitation).     [provider]  medroxyPROGESTERone (DEPO-PROVERA) 150 MG/ML injection Inject 150 mg into the muscle every 3 (three) months.  07/27/15   [provider]  medroxyPROGESTERone Acetate 150 MG/ML SUSY  07/06/19   [provider]  miconazole (MICONAZOLE 7) 100  MG vaginal suppository Place 100 mg vaginally at bedtime as needed.    [provider]  Multiple Vitamins-Minerals (MULTIVITAMIN WITH MINERALS) tablet Take by mouth.    [provider]  nitrofurantoin (MACRODANTIN) 100 MG capsule  06/19/18   [provider]  omeprazole (PRILOSEC) 20 MG capsule Take 40 mg by mouth 2 (two) times daily.     [provider]  ondansetron (ZOFRAN) 4 MG tablet Take 4 mg by mouth 3 (three) times daily as needed for nausea or vomiting.    [provider]  polyethylene glycol (MIRALAX / GLYCOLAX) packet Take 17 g by mouth daily as needed for moderate constipation.     [provider]  potassium chloride (K-DUR) 10 MEQ tablet  04/15/19   [provider]  Probiotic Product (PROBIOTIC COLON SUPPORT PO) Take 1 capsule by mouth daily.    [provider]  risperiDONE (RISPERDAL) 2 MG tablet Take 2 mg by mouth 2 (two) times daily.     [provider]  rosuvastatin (CRESTOR) 40  MG tablet  07/16/19   [provider]  simvastatin (ZOCOR) 40 MG tablet simvastatin 40 mg tablet    [provider]  sulfamethoxazole-trimethoprim (BACTRIM DS) 800-160 MG tablet  07/10/19   [provider]  umeclidinium bromide (INCRUSE ELLIPTA) 62.5 MCG/INH AEPB Inhale 1 puff into the lungs daily.    [provider]  UNABLE TO FIND by Does not apply route. 06/01/14   [provider]  UNABLE TO FIND Inject into the skin.    [provider]  varenicline (CHANTIX) 1 MG tablet Take 1 mg by mouth 2 (two) times daily.     [provider]    Allergies Penicillin g and Penicillins  Family History  Problem Relation Age of Onset  . Diabetes Mother   . Diabetes Father     Social History Social History   Tobacco Use  . Smoking status: Current Every Day Smoker    Packs/day: 1.00    Years: 28.00    Pack years: 28.00    Types: Cigarettes  . Smokeless tobacco: Never Used   Substance Use Topics  . Alcohol use: No  . Drug use: Yes    Types: Marijuana    Review of Systems  Constitutional: No fever/chills Eyes: No visual changes. ENT: No sore throat. Respiratory: Denies cough Cardiovascular: Denies chest pain Gastrointestinal: Denies abdominal pain Genitourinary: Negative for dysuria. Musculoskeletal: Negative for back pain.  Positive for neck and right shoulder pain Skin: Negative for rash. Psychiatric: no mood changes,     ____________________________________________   PHYSICAL EXAM:  VITAL SIGNS: ED Triage Vitals  Enc Vitals Group     BP 07/07/20 1232 132/76     Pulse Rate 07/07/20 1232 86     Resp 07/07/20 1232 16     Temp 07/07/20 1232 97.7 F (36.5 C)     Temp Source 07/07/20 1232 Oral     SpO2 07/07/20 1232 98 %     Weight 07/07/20 1228 220 lb (99.8 kg)     Height 07/07/20 1228 5\' 5"  (1.651 m)     Head Circumference --      Peak Flow --      Pain Score 07/07/20 1228 10     Pain Loc --      Pain Edu? --      Excl. in GC? --     Constitutional: Alert and oriented. Well appearing and in no acute distress. Eyes: Conjunctivae are normal.  Head: Atraumatic. Nose: No congestion/rhinnorhea. Mouth/Throat: Mucous membranes are moist.   Neck:  supple no lymphadenopathy noted Cardiovascular: Normal rate, regular rhythm. Heart sounds are normal Respiratory: Normal respiratory effort.  No retractions, lungs c t a  Abd: soft nontender bs normal all 4 quad GU: deferred Musculoskeletal: Decreased range of motion of the right shoulder, C-spine tender, neurovascular intact  neurologic:  Normal speech and language.  Patient is talking as if she is a child Skin:  Skin is warm, dry and intact. No rash noted. Psychiatric: Mood and affect are normal. Speech and behavior are normal.  ____________________________________________   LABS (all labs ordered are listed, but only abnormal results are displayed)  Labs Reviewed - No data to  display ____________________________________________   ____________________________________________  RADIOLOGY  X-ray of the right shoulder is negative X-ray of C-spine is negative  ____________________________________________   PROCEDURES  Procedure(s) performed: No  Procedures    ____________________________________________   INITIAL IMPRESSION / ASSESSMENT AND PLAN / ED COURSE  Pertinent labs & imaging results that  were available during my care of the patient were reviewed by me and considered in my medical decision making (see chart for details).   Patient is 44 year old female presents emergency department right shoulder pain and neck pain after a fall 2 days ago.  See HPI  Physical exam does show the patient to appear well.  No swelling is noted at the right arm/shoulder.  Head is nontender.  C-spine is tender.  Remainder the exam is unremarkable.  X-ray of the right shoulder is negative X-ray of the C-spine   X-rays are normal.  Told patient this is very reassuring.  She was placed in a sling.  She is to follow-up with orthopedics if not better in 1 week.  Take over-the-counter Tylenol and ibuprofen for pain if needed.  She was discharged in stable condition.   As part of my medical decision making, I reviewed the following data within the electronic MEDICAL RECORD NUMBER Nursing notes reviewed and incorporated, Old chart reviewed, Radiograph reviewed , Notes from prior ED visits and [place] Controlled Substance Database  ____________________________________________   FINAL CLINICAL IMPRESSION(S) / ED DIAGNOSES  Final diagnoses:  Fall, initial encounter  Multiple contusions  Cervical sprain, initial encounter      NEW MEDICATIONS STARTED DURING THIS VISIT:  New Prescriptions   No medications on file     Note:  This document was prepared using Dragon voice recognition software and may include unintentional dictation errors.    Faythe Ghee,  PA-C 07/07/20 1605    Willy Eddy, MD 07/08/20 [phone]

## 2020-07-07 NOTE — ED Triage Notes (Signed)
Pt reports slipped in the shower and hurt her right arm. Pt reports pain has gotten worse and can't lift her right arm. Pt reports pain is shoulder area.

## 2020-07-07 NOTE — ED Triage Notes (Signed)
Pt in via EMS from home wit c/o right arm pain. Pt fell in the shower on Tuesday, slipped and fell. Pain in right arm got worse today. 129/74, 96%RA, 80 HR, CBG 103

## 2020-09-08 ENCOUNTER — Ambulatory Visit (INDEPENDENT_AMBULATORY_CARE_PROVIDER_SITE_OTHER): Payer: Medicare Other | Admitting: Podiatry

## 2020-09-08 ENCOUNTER — Encounter: Payer: Self-pay | Admitting: Podiatry

## 2020-09-08 ENCOUNTER — Other Ambulatory Visit: Payer: Self-pay

## 2020-09-08 DIAGNOSIS — E119 Type 2 diabetes mellitus without complications: Secondary | ICD-10-CM | POA: Diagnosis not present

## 2020-09-08 DIAGNOSIS — M79676 Pain in unspecified toe(s): Secondary | ICD-10-CM

## 2020-09-08 DIAGNOSIS — B351 Tinea unguium: Secondary | ICD-10-CM | POA: Diagnosis not present

## 2020-09-08 DIAGNOSIS — M79609 Pain in unspecified limb: Secondary | ICD-10-CM

## 2020-09-08 NOTE — Progress Notes (Signed)
This patient returns to my office for at risk foot care.  This patient requires this care by a professional since this patient will be at risk due to having diabetes with no complications and HIV.  This patient is unable to cut nails herself since the patient cannot reach her nails.These nails are painful walking and wearing shoes.  This patient presents for at risk foot care today.  General Appearance  Alert, conversant and in no acute stress.  Vascular  Dorsalis pedis and posterior tibial  pulses are palpable  bilaterally.  Capillary return is within normal limits  bilaterally. Temperature is within normal limits  bilaterally.  Neurologic  Senn-Weinstein monofilament wire test within normal limits  bilaterally. Muscle power within normal limits bilaterally.  Nails Thick disfigured discolored nails with subungual debris  from hallux to fifth toes bilaterally. No evidence of bacterial infection or drainage bilaterally.  Orthopedic  No limitations of motion  feet .  No crepitus or effusions noted.  No bony pathology or digital deformities noted. HAV  B/L.  Skin  normotropic skin with no porokeratosis noted bilaterally.  No signs of infections or ulcers noted.     Onychomycosis  Pain in right toes  Pain in left toes  Consent was obtained for treatment procedures.   Mechanical debridement of nails 1-5  bilaterally performed with a nail nipper.  Filed with dremel without incident.    Return office visit  3 months                    Told patient to return for periodic foot care and evaluation due to potential at risk complications.   Sharai Overbay DPM  

## 2020-11-07 ENCOUNTER — Other Ambulatory Visit: Payer: Self-pay | Admitting: Student

## 2020-11-07 ENCOUNTER — Other Ambulatory Visit (HOSPITAL_COMMUNITY): Payer: Self-pay | Admitting: Student

## 2020-11-07 DIAGNOSIS — M7581 Other shoulder lesions, right shoulder: Secondary | ICD-10-CM

## 2020-11-07 DIAGNOSIS — Z9181 History of falling: Secondary | ICD-10-CM

## 2020-11-07 DIAGNOSIS — M7501 Adhesive capsulitis of right shoulder: Secondary | ICD-10-CM

## 2020-11-22 ENCOUNTER — Ambulatory Visit
Admission: RE | Admit: 2020-11-22 | Discharge: 2020-11-22 | Disposition: A | Discharge status: Home / Self Care | Payer: Medicare Other | Source: Ambulatory Visit | Attending: Student | Admitting: Student

## 2020-11-22 ENCOUNTER — Other Ambulatory Visit: Payer: Self-pay

## 2020-11-22 DIAGNOSIS — M7581 Other shoulder lesions, right shoulder: Secondary | ICD-10-CM | POA: Diagnosis not present

## 2020-11-22 DIAGNOSIS — M7501 Adhesive capsulitis of right shoulder: Secondary | ICD-10-CM | POA: Insufficient documentation

## 2020-11-22 DIAGNOSIS — Z9181 History of falling: Secondary | ICD-10-CM | POA: Diagnosis present

## 2020-11-30 ENCOUNTER — Encounter: Payer: Self-pay | Admitting: Intensive Care

## 2020-11-30 ENCOUNTER — Emergency Department
Admission: EM | Admit: 2020-11-30 | Discharge: 2020-11-30 | Disposition: A | Discharge status: Home / Self Care | Payer: Medicare Other | Attending: Emergency Medicine | Admitting: Emergency Medicine

## 2020-11-30 ENCOUNTER — Other Ambulatory Visit: Payer: Self-pay

## 2020-11-30 DIAGNOSIS — E1165 Type 2 diabetes mellitus with hyperglycemia: Secondary | ICD-10-CM | POA: Diagnosis present

## 2020-11-30 DIAGNOSIS — R42 Dizziness and giddiness: Secondary | ICD-10-CM | POA: Diagnosis not present

## 2020-11-30 DIAGNOSIS — R251 Tremor, unspecified: Secondary | ICD-10-CM | POA: Insufficient documentation

## 2020-11-30 DIAGNOSIS — M25511 Pain in right shoulder: Secondary | ICD-10-CM | POA: Insufficient documentation

## 2020-11-30 DIAGNOSIS — Z5321 Procedure and treatment not carried out due to patient leaving prior to being seen by health care provider: Secondary | ICD-10-CM | POA: Insufficient documentation

## 2020-11-30 LAB — BASIC METABOLIC PANEL
Anion gap: 9 (ref 5–15)
BUN: 10 mg/dL (ref 6–20)
CO2: 24 mmol/L (ref 22–32)
Calcium: 8.9 mg/dL (ref 8.9–10.3)
Chloride: 101 mmol/L (ref 98–111)
Creatinine, Ser: 0.74 mg/dL (ref 0.44–1.00)
GFR, Estimated: >60 mL/min (ref >60)
Glucose, Bld: 115 mg/dL — ABNORMAL HIGH (ref 70–99)
Potassium: 3.1 mmol/L — ABNORMAL LOW (ref 3.5–5.1)
Sodium: 134 mmol/L — ABNORMAL LOW (ref 135–145)

## 2020-11-30 LAB — CBC
HCT: 44.6 % (ref 36.0–46.0)
Hemoglobin: 16.4 g/dL — ABNORMAL HIGH (ref 12.0–15.0)
MCH: 33.7 pg (ref 26.0–34.0)
MCHC: 36.8 g/dL — ABNORMAL HIGH (ref 30.0–36.0)
MCV: 91.6 fL (ref 80.0–100.0)
Platelets: 198 10*3/uL (ref 150–400)
RBC: 4.87 MIL/uL (ref 3.87–5.11)
RDW: 11.8 % (ref 11.5–15.5)
WBC: 11.9 10*3/uL — ABNORMAL HIGH (ref 4.0–10.5)
nRBC: 0 % (ref 0.0–0.2)

## 2020-11-30 LAB — CBG MONITORING, ED: Glucose-Capillary: 129 mg/dL — ABNORMAL HIGH (ref 70–99)

## 2020-11-30 NOTE — ED Triage Notes (Signed)
Patient arrived by case worker from home with c/o hyperglycemia. Pt reports she was eating dinner and started to feel bad. Reports blood sugar at home was 204. Patient c/o "shaking, dizzy, and nodding." pt also reports hx seizures. Reports being compliant with seizure and diabetes medications. Patient requesting pain meds for her right shoulder. Reports she has been seeing orthopedist due to fall X2 months ago and had MRI with no results yet. Speech seems mumbled. Case worker reports her speech is normal and her baseline. Blood sugar 129 in triage.

## 2020-12-07 ENCOUNTER — Other Ambulatory Visit: Payer: Self-pay | Admitting: Student

## 2020-12-07 DIAGNOSIS — M5412 Radiculopathy, cervical region: Secondary | ICD-10-CM

## 2020-12-07 DIAGNOSIS — M79601 Pain in right arm: Secondary | ICD-10-CM

## 2020-12-08 ENCOUNTER — Ambulatory Visit (INDEPENDENT_AMBULATORY_CARE_PROVIDER_SITE_OTHER): Payer: Medicare Other | Admitting: Podiatry

## 2020-12-08 ENCOUNTER — Encounter: Payer: Self-pay | Admitting: Podiatry

## 2020-12-08 ENCOUNTER — Other Ambulatory Visit: Payer: Self-pay

## 2020-12-08 DIAGNOSIS — B351 Tinea unguium: Secondary | ICD-10-CM

## 2020-12-08 DIAGNOSIS — E119 Type 2 diabetes mellitus without complications: Secondary | ICD-10-CM | POA: Diagnosis not present

## 2020-12-08 DIAGNOSIS — M79609 Pain in unspecified limb: Secondary | ICD-10-CM

## 2020-12-08 NOTE — Progress Notes (Signed)
This patient returns to my office for at risk foot care.  This patient requires this care by a professional since this patient will be at risk due to having diabetes with no complications and HIV.  This patient is unable to cut nails herself since the patient cannot reach her nails.These nails are painful walking and wearing shoes.  This patient presents for at risk foot care today.  General Appearance  Alert, conversant and in no acute stress.  Vascular  Dorsalis pedis and posterior tibial  pulses are palpable  bilaterally.  Capillary return is within normal limits  bilaterally. Temperature is within normal limits  bilaterally.  Neurologic  Senn-Weinstein monofilament wire test within normal limits  bilaterally. Muscle power within normal limits bilaterally.  Nails Thick disfigured discolored nails with subungual debris  from hallux to fifth toes bilaterally. No evidence of bacterial infection or drainage bilaterally.  Orthopedic  No limitations of motion  feet .  No crepitus or effusions noted.  No bony pathology or digital deformities noted. HAV  B/L.  Skin  normotropic skin with no porokeratosis noted bilaterally.  No signs of infections or ulcers noted.     Onychomycosis  Pain in right toes  Pain in left toes  Consent was obtained for treatment procedures.   Mechanical debridement of nails 1-5  bilaterally performed with a nail nipper.  Filed with dremel without incident.    Return office visit  3 months                    Told patient to return for periodic foot care and evaluation due to potential at risk complications.   Koby Pickup DPM  

## 2020-12-15 ENCOUNTER — Other Ambulatory Visit: Payer: Self-pay

## 2020-12-15 ENCOUNTER — Ambulatory Visit
Admission: RE | Admit: 2020-12-15 | Discharge: 2020-12-15 | Disposition: A | Discharge status: Home / Self Care | Payer: Medicare Other | Source: Ambulatory Visit | Attending: Student | Admitting: Student

## 2020-12-15 DIAGNOSIS — M79601 Pain in right arm: Secondary | ICD-10-CM | POA: Insufficient documentation

## 2020-12-15 DIAGNOSIS — M5412 Radiculopathy, cervical region: Secondary | ICD-10-CM | POA: Insufficient documentation

## 2021-01-05 ENCOUNTER — Emergency Department: Payer: Medicare Other

## 2021-01-05 ENCOUNTER — Other Ambulatory Visit: Payer: Self-pay

## 2021-01-05 DIAGNOSIS — Z794 Long term (current) use of insulin: Secondary | ICD-10-CM | POA: Insufficient documentation

## 2021-01-05 DIAGNOSIS — J449 Chronic obstructive pulmonary disease, unspecified: Secondary | ICD-10-CM | POA: Diagnosis not present

## 2021-01-05 DIAGNOSIS — E1169 Type 2 diabetes mellitus with other specified complication: Secondary | ICD-10-CM | POA: Diagnosis not present

## 2021-01-05 DIAGNOSIS — N3000 Acute cystitis without hematuria: Secondary | ICD-10-CM | POA: Insufficient documentation

## 2021-01-05 DIAGNOSIS — E7800 Pure hypercholesterolemia, unspecified: Secondary | ICD-10-CM | POA: Diagnosis not present

## 2021-01-05 DIAGNOSIS — Z7951 Long term (current) use of inhaled steroids: Secondary | ICD-10-CM | POA: Diagnosis not present

## 2021-01-05 DIAGNOSIS — R42 Dizziness and giddiness: Secondary | ICD-10-CM | POA: Diagnosis not present

## 2021-01-05 DIAGNOSIS — E039 Hypothyroidism, unspecified: Secondary | ICD-10-CM | POA: Insufficient documentation

## 2021-01-05 DIAGNOSIS — Z79899 Other long term (current) drug therapy: Secondary | ICD-10-CM | POA: Insufficient documentation

## 2021-01-05 LAB — CBC
HCT: 43.3 % (ref 36.0–46.0)
Hemoglobin: 15.5 g/dL — ABNORMAL HIGH (ref 12.0–15.0)
MCH: 32.8 pg (ref 26.0–34.0)
MCHC: 35.8 g/dL (ref 30.0–36.0)
MCV: 91.5 fL (ref 80.0–100.0)
Platelets: 189 10*3/uL (ref 150–400)
RBC: 4.73 MIL/uL (ref 3.87–5.11)
RDW: 12.2 % (ref 11.5–15.5)
WBC: 9.8 10*3/uL (ref 4.0–10.5)
nRBC: 0 % (ref 0.0–0.2)

## 2021-01-05 LAB — COMPREHENSIVE METABOLIC PANEL
ALT: 36 U/L (ref 0–44)
AST: 40 U/L (ref 15–41)
Albumin: 3.8 g/dL (ref 3.5–5.0)
Alkaline Phosphatase: 43 U/L (ref 38–126)
Anion gap: 10 (ref 5–15)
BUN: 10 mg/dL (ref 6–20)
CO2: 26 mmol/L (ref 22–32)
Calcium: 9.3 mg/dL (ref 8.9–10.3)
Chloride: 103 mmol/L (ref 98–111)
Creatinine, Ser: 0.76 mg/dL (ref 0.44–1.00)
GFR, Estimated: >60 mL/min (ref >60)
Glucose, Bld: 118 mg/dL — ABNORMAL HIGH (ref 70–99)
Potassium: 3.2 mmol/L — ABNORMAL LOW (ref 3.5–5.1)
Sodium: 139 mmol/L (ref 135–145)
Total Bilirubin: 0.7 mg/dL (ref 0.3–1.2)
Total Protein: 7.8 g/dL (ref 6.5–8.1)

## 2021-01-05 LAB — DIFFERENTIAL
Abs Immature Granulocytes: 0.03 10*3/uL (ref 0.00–0.07)
Basophils Absolute: 0.1 10*3/uL (ref 0.0–0.1)
Basophils Relative: 1 %
Eosinophils Absolute: 0.1 10*3/uL (ref 0.0–0.5)
Eosinophils Relative: 1 %
Immature Granulocytes: 0 %
Lymphocytes Relative: 35 %
Lymphs Abs: 3.4 10*3/uL (ref 0.7–4.0)
Monocytes Absolute: 0.9 10*3/uL (ref 0.1–1.0)
Monocytes Relative: 9 %
Neutro Abs: 5.3 10*3/uL (ref 1.7–7.7)
Neutrophils Relative %: 54 %

## 2021-01-05 LAB — ETHANOL: Alcohol, Ethyl (B): <10 mg/dL (ref <10)

## 2021-01-05 NOTE — ED Triage Notes (Signed)
Pt to ED via EMS home for chief complaint of weakness x 1 week. Slow speech, oriented and alert. States she is "just tired" Pt appears to be poor historian, answering questions differently when asked repeatedly.   Denies drug use for 2 weeks. No alcohol use since yesterday.

## 2021-01-05 NOTE — ED Triage Notes (Signed)
Pt comes into the ED via EMS from home with c/o generalized weakness for the past month, states she has been clean from crack cocaine for the past month 140/70 81 94%RA [id number]

## 2021-01-06 ENCOUNTER — Emergency Department
Admission: EM | Admit: 2021-01-06 | Discharge: 2021-01-06 | Disposition: A | Discharge status: Home / Self Care | Payer: Medicare Other | Attending: Emergency Medicine | Admitting: Emergency Medicine

## 2021-01-06 ENCOUNTER — Telehealth: Payer: Self-pay | Admitting: Emergency Medicine

## 2021-01-06 DIAGNOSIS — R42 Dizziness and giddiness: Secondary | ICD-10-CM

## 2021-01-06 DIAGNOSIS — N3000 Acute cystitis without hematuria: Secondary | ICD-10-CM

## 2021-01-06 LAB — URINE DRUG SCREEN, QUALITATIVE (ARMC ONLY)
Amphetamines, Ur Screen: NOT DETECTED
Barbiturates, Ur Screen: NOT DETECTED
Benzodiazepine, Ur Scrn: NOT DETECTED
Cannabinoid 50 Ng, Ur ~~LOC~~: POSITIVE — AB
Cocaine Metabolite,Ur ~~LOC~~: NOT DETECTED
MDMA (Ecstasy)Ur Screen: NOT DETECTED
Methadone Scn, Ur: NOT DETECTED
Opiate, Ur Screen: NOT DETECTED
Phencyclidine (PCP) Ur S: NOT DETECTED
Tricyclic, Ur Screen: NOT DETECTED

## 2021-01-06 LAB — URINALYSIS, COMPLETE (UACMP) WITH MICROSCOPIC
Bilirubin Urine: NEGATIVE
Glucose, UA: NEGATIVE mg/dL
Ketones, ur: NEGATIVE mg/dL
Nitrite: NEGATIVE
Protein, ur: 30 mg/dL — AB
Specific Gravity, Urine: 1.024 (ref 1.005–1.030)
pH: 5 (ref 5.0–8.0)

## 2021-01-06 MED ORDER — CEPHALEXIN 500 MG PO CAPS: 500.0000 mg | ORAL_CAPSULE | Freq: Two times a day (BID) | ORAL | 0 refills | Status: DC

## 2021-01-06 MED ORDER — CEPHALEXIN 500 MG PO CAPS
500.0000 mg | ORAL_CAPSULE | Freq: Once | ORAL | Status: AC
  Administered 2021-01-06: 500 mg via ORAL
  Filled 2021-01-06: qty 1

## 2021-01-06 MED ORDER — POTASSIUM CHLORIDE CRYS ER 20 MEQ PO TBCR
40.0000 meq | EXTENDED_RELEASE_TABLET | Freq: Once | ORAL | Status: AC
  Administered 2021-01-06: 40 meq via ORAL
  Filled 2021-01-06: qty 2

## 2021-01-06 MED ORDER — CEPHALEXIN 500 MG PO CAPS
500.0000 mg | ORAL_CAPSULE | Freq: Two times a day (BID) | ORAL | 0 refills | Status: AC
Start: 2021-01-06 — End: 2021-01-13

## 2021-01-06 NOTE — Telephone Encounter (Signed)
abx rx sent to wrong pharmacy ... will resend.

## 2021-01-06 NOTE — ED Notes (Signed)
Pt to the bathroom. NAD at this time.

## 2021-01-06 NOTE — ED Provider Notes (Signed)
[facility] Emergency Department Provider Note   ____________________________________________   Event Date/Time   First MD Initiated Contact with Patient 01/06/21 0215     (approximate)  I have reviewed the triage vital signs and the nursing notes.   HISTORY  Chief Complaint Weakness    HPI Kristen Villegas is a 45 y.o. female with past medical history of HIV/AIDS, progressive multifocal leukoencephalopathy, seizures, diabetes, hyperlipidemia, and cocaine abuse who presents to the ED complaining of dizziness.  Patient reports that throughout the day today she has been feeling dizzy and lightheaded, also states she has been feeling more fatigued than usual.  She denies any fevers, cough, chest pain, shortness of breath, headache, or abdominal pain.  She does endorse recent diarrhea along with some dysuria, denies any blood in her stool.  She has not had any sick contacts.  She states she has been compliant with her antiretroviral medications at her most recent viral load was undetectable.        Past Medical History:  Diagnosis Date  . Cholelithiasis   . Chronic back pain   . Collapsed lung   . COPD (chronic obstructive pulmonary disease) ([place])   . Diabetes mellitus without complication ([place])   . GERD (gastroesophageal reflux disease)   . Headache   . 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 HIV disease ([place])   . Hypercholesterolemia   . Hypothyroidism   . Memory difficulties   . PML (progressive multifocal leukoencephalopathy) ([place])   . Recurrent UTI   . Seizures [facility])     Patient Active Problem List   Diagnosis Date Noted  . Seizures ([place]) 06/03/2020  . Smoker 02/07/2016  . Dysuria 09/20/2015  . Cervical dysplasia 06/15/2015  . Tinea capitis 08/31/2014  . Cocaine abuse ([place]) 09/08/2013  . Progressive multifocal leukoencephalopathy ([place]) 09/07/2013  . Multiphasic screening 04/24/2013  .  Hyperlipidemia 04/23/2013  . TSH deficiency 04/23/2013  . Diabetes ([place]) 04/21/2013  . HIV disease ([place]) 04/21/2013    Past Surgical History:  Procedure Laterality Date  . CHOLECYSTECTOMY N/A 01/25/2017   Procedure: LAPAROSCOPIC CHOLECYSTECTOMY WITH INTRAOPERATIVE CHOLANGIOGRAM;  Surgeon: Georganna Skeans, MD;  Location: [facility];  Service: General;  Laterality: N/A;  . COLONOSCOPY WITH PROPOFOL N/A 01/08/2020   Procedure: COLONOSCOPY WITH PROPOFOL;  Surgeon: Robert Bellow, MD;  Location: [facility] ENDOSCOPY;  Service: Endoscopy;  Laterality: N/A;  . ESOPHAGOGASTRODUODENOSCOPY (EGD) WITH PROPOFOL N/A 01/08/2020   Procedure: ESOPHAGOGASTRODUODENOSCOPY (EGD) WITH PROPOFOL;  Surgeon: Robert Bellow, MD;  Location: [facility] ENDOSCOPY;  Service: Endoscopy;  Laterality: N/A;  . LUNG SURGERY      Prior to Admission medications   Medication Sig Start Date End Date Taking? Authorizing Provider  cephALEXin (KEFLEX) 500 MG capsule Take 1 capsule (500 mg total) by mouth 2 (two) times daily for 7 days. 01/06/21 01/13/21 Yes Blake Divine, MD  acetaminophen (TYLENOL) 325 MG tablet Take 650 mg by mouth every 6 (six) hours as needed for moderate pain or headache.  01/02/14   [provider]  albuterol (PROVENTIL HFA;VENTOLIN HFA) 108 (90 Base) MCG/ACT inhaler Inhale 2 puffs into the lungs 4 (four) times daily as needed for wheezing or shortness of breath.  04/21/13   [provider]  BD PEN NEEDLE NANO U/F 32G X 4 MM MISC  08/03/19   [provider]  benztropine (COGENTIN) 1 MG tablet Take 1 mg by mouth daily.     [provider]  buPROPion (WELLBUTRIN XL) 300 MG 24 hr tablet Take 300 mg by mouth daily.     [provider]  cetirizine (ZYRTEC) 10 MG tablet Take 10 mg by mouth daily.     [provider]  cyclobenzaprine (FLEXERIL) 10 MG tablet Take 1 tablet (10 mg total) by mouth 3 (three) times daily as needed. Patient taking differently: Take 10 mg by mouth 3 (three)  times daily as needed for muscle spasms.  01/21/17   Joni Reining, PA-C  dicyclomine (BENTYL) 10 MG capsule Take 10 mg by mouth 4 (four) times daily -  before meals and at bedtime.    [provider]  docusate sodium (STOOL SOFTENER) 100 MG capsule Take 100 mg by mouth 2 (two) times daily.  01/02/14   [provider]  elvitegravir-cobicistat-emtricitabine-tenofovir (GENVOYA) 150-150-200-10 MG TABS tablet Take by mouth. 04/11/20   [provider]  elvitegravir-cobicistat-emtricitabine-tenofovir (STRIBILD) 150-150-200-300 MG TABS tablet Take 1 tablet by mouth daily with breakfast.  03/26/16   [provider]  ezetimibe (ZETIA) 10 MG tablet Take 10 mg by mouth daily.    [provider]  FIASP FLEXTOUCH 100 UNIT/ML [facility]  05/07/19   [provider]  glucose blood (PRECISION QID TEST) test strip by Other route.    [provider]  hydrocortisone 2.5 % cream Apply 1 application topically daily as needed.     [provider]  hydrOXYzine (VISTARIL) 25 MG capsule  06/19/18   [provider]  ibuprofen (ADVIL,MOTRIN) 800 MG tablet Take 1 tablet (800 mg total) by mouth every 8 (eight) hours as needed for moderate pain. Patient taking differently: Take 600 mg by mouth every 6 (six) hours as needed for moderate pain.  07/09/15   Joni Reining, PA-C  INGREZZA 40 MG CAPS  07/16/19   [provider]  insulin NPH Human (HUMULIN N,NOVOLIN N) 100 UNIT/ML injection Inject 5-10 Units into the skin See admin instructions. Inject 10 units SQ in the morning and inject 5 units SQ before supper    [provider]  ketorolac (TORADOL) 10 MG tablet  04/06/19   [provider]  Lactobacillus Rhamnosus, GG, (CULTURELLE) CAPS Take by mouth.    [provider]  levETIRAcetam (KEPPRA) 1000 MG tablet  07/16/19   [provider]  levETIRAcetam (KEPPRA) 500 MG tablet Take 1,000 mg by mouth 2 (two) times daily.  06/19/18    [provider]  Karlene Einstein 72 MCG capsule  11/24/18   [provider]  LORazepam (ATIVAN) 0.5 MG tablet Take 0.5 mg by mouth every 6 (six) hours as needed (for agitation).     [provider]  medroxyPROGESTERone (DEPO-PROVERA) 150 MG/ML injection Inject 150 mg into the muscle every 3 (three) months.  07/27/15   [provider]  medroxyPROGESTERone Acetate 150 MG/ML SUSY  07/06/19   [provider]  miconazole (MICONAZOLE 7) 100 MG vaginal suppository Place 100 mg vaginally at bedtime as needed.    [provider]  Multiple Vitamins-Minerals (MULTIVITAMIN WITH MINERALS) tablet Take by mouth.    [provider]  nitrofurantoin (MACRODANTIN) 100 MG capsule  06/19/18   [provider]  omeprazole (PRILOSEC) 20 MG capsule Take 40 mg by mouth 2 (two) times daily.     [provider]  ondansetron (ZOFRAN) 4 MG tablet Take 4 mg by mouth 3 (three) times daily as needed for nausea or vomiting.    [provider]  polyethylene glycol (MIRALAX / GLYCOLAX) packet  Take 17 g by mouth daily as needed for moderate constipation.     [provider]  potassium chloride (K-DUR) 10 MEQ tablet  04/15/19   [provider]  Probiotic Product (PROBIOTIC COLON SUPPORT PO) Take 1 capsule by mouth daily.    [provider]  risperiDONE (RISPERDAL) 2 MG tablet Take 2 mg by mouth 2 (two) times daily.     [provider]  rosuvastatin (CRESTOR) 40 MG tablet  07/16/19   [provider]  simvastatin (ZOCOR) 40 MG tablet simvastatin 40 mg tablet    [provider]  sulfamethoxazole-trimethoprim (BACTRIM DS) 800-160 MG tablet  07/10/19   [provider]  umeclidinium bromide (INCRUSE ELLIPTA) 62.5 MCG/INH AEPB Inhale 1 puff into the lungs daily.    [provider]  UNABLE TO FIND by Does not apply route. 06/01/14   [provider]  UNABLE TO FIND Inject into the skin.     [provider]  varenicline (CHANTIX) 1 MG tablet Take 1 mg by mouth 2 (two) times daily.     [provider]    Allergies Penicillin g and Penicillins  Family History  Problem Relation Age of Onset  . Diabetes Mother   . Diabetes Father     Social History Social History   Tobacco Use  . Smoking status: Current Every Day Smoker    Packs/day: 1.00    Years: 28.00    Pack years: 28.00    Types: Cigarettes  . Smokeless tobacco: Never Used  Substance Use Topics  . Alcohol use: Yes    Comment: Quit October 2021  . Drug use: Not Currently    Types: Marijuana    Review of Systems  Constitutional: No fever/chills.  Positive for fatigue and lightheadedness. Eyes: No visual changes. ENT: No sore throat. Cardiovascular: Denies chest pain. Respiratory: Denies shortness of breath. Gastrointestinal: No abdominal pain.  No nausea, no vomiting.  Positive for diarrhea.  No constipation. Genitourinary: Positive for dysuria. Musculoskeletal: Negative for back pain. Skin: Negative for rash. Neurological: Negative for headaches, focal weakness or numbness.  ____________________________________________   PHYSICAL EXAM:  VITAL SIGNS: ED Triage Vitals  Enc Vitals Group     BP 01/05/21 1747 119/85     Pulse Rate 01/05/21 1747 83     Resp 01/05/21 1747 18     Temp 01/05/21 1747 98.9 F (37.2 C)     Temp Source 01/05/21 1747 Oral     SpO2 01/05/21 1747 97 %     Weight 01/05/21 1758 228 lb (103.4 kg)     Height 01/05/21 1758 5\' 5"  (1.651 m)     Head Circumference --      Peak Flow --      Pain Score 01/05/21 1757 5     Pain Loc --      Pain Edu? --      Excl. in GC? --     Constitutional: Alert and oriented. Eyes: Conjunctivae are normal. Head: Atraumatic. Nose: No congestion/rhinnorhea. Mouth/Throat: Mucous membranes are moist. Neck: Normal ROM Cardiovascular: Normal rate, regular rhythm. Grossly normal heart sounds. Respiratory: Normal respiratory  effort.  No retractions. Lungs CTAB. Gastrointestinal: Soft and nontender. No distention. Genitourinary: deferred Musculoskeletal: No lower extremity tenderness nor edema. Neurologic:  Normal speech and language. No gross focal neurologic deficits are appreciated. Skin:  Skin is warm, dry and intact. No rash noted. Psychiatric: Mood and affect are normal. Speech and behavior are normal.  ____________________________________________   LABS (all  labs ordered are listed, but only abnormal results are displayed)  Labs Reviewed  CBC - Abnormal; Notable for the following components:      Result Value   Hemoglobin 15.5 (*)    All other components within normal limits  COMPREHENSIVE METABOLIC PANEL - Abnormal; Notable for the following components:   Potassium 3.2 (*)    Glucose, Bld 118 (*)    All other components within normal limits  URINE DRUG SCREEN, QUALITATIVE ([facility] ONLY) - Abnormal; Notable for the following components:   Cannabinoid 50 Ng, Ur [place] POSITIVE (*)    All other components within normal limits  URINALYSIS, COMPLETE (UACMP) WITH MICROSCOPIC - Abnormal; Notable for the following components:   Color, Urine AMBER (*)    APPearance HAZY (*)    Hgb urine dipstick SMALL (*)    Protein, ur 30 (*)    Leukocytes,Ua TRACE (*)    Bacteria, UA MANY (*)    All other components within normal limits  URINE CULTURE  DIFFERENTIAL  ETHANOL   ____________________________________________  EKG  ED ECG REPORT I, Chesley Noon, the attending physician, personally viewed and interpreted this ECG.   Date: 01/06/2021  EKG Time: 18:06  Rate: 86  Rhythm: normal sinus rhythm  Axis: Normal  Intervals:none  ST&T Change: None   PROCEDURES  Procedure(s) performed (including Critical Care):  Procedures   ____________________________________________   INITIAL IMPRESSION / ASSESSMENT AND PLAN / ED COURSE       45 year old female with past medical history of HIV/AIDS,  progressive multifocal leukoencephalopathy, seizures, diabetes, hyperlipidemia, and cocaine use who presents to the ED for dizziness and lightheadedness.  Patient does have some cognitive delays at baseline, mental status unchanged today.  While she does have a history of HIV/AIDS, she has been compliant with medications and recent viral load has been undetectable along with CD4 count of greater than 200.  No signs of infection at this time, labs and vital signs are reassuring.  She may be mildly dehydrated from recent diarrhea, does have mild hypokalemia that we will replete.  She endorses dysuria and we will check UA, but patient appears appropriate for discharge home with PCP follow-up.  UA appears consistent with UTI, we will send for culture and patient was given initial dose of Keflex.  She tolerated Keflex without difficulty and is appropriate for discharge home with PCP follow-up.  She was counseled to return to the ED for new worsening symptoms, patient agrees with plan.      ____________________________________________   FINAL CLINICAL IMPRESSION(S) / ED DIAGNOSES  Final diagnoses:  Lightheadedness  Acute cystitis without hematuria     ED Discharge Orders         Ordered    cephALEXin (KEFLEX) 500 MG capsule  2 times daily        01/06/21 0415           Note:  This document was prepared using Dragon voice recognition software and may include unintentional dictation errors.   Chesley Noon, MD 01/06/21 [phone]

## 2021-01-07 LAB — URINE CULTURE

## 2021-03-09 ENCOUNTER — Encounter: Payer: Self-pay | Admitting: Podiatry

## 2021-03-09 ENCOUNTER — Other Ambulatory Visit: Payer: Self-pay

## 2021-03-09 ENCOUNTER — Ambulatory Visit (INDEPENDENT_AMBULATORY_CARE_PROVIDER_SITE_OTHER): Payer: Medicare Other | Admitting: Podiatry

## 2021-03-09 DIAGNOSIS — M79609 Pain in unspecified limb: Secondary | ICD-10-CM

## 2021-03-09 DIAGNOSIS — E119 Type 2 diabetes mellitus without complications: Secondary | ICD-10-CM

## 2021-03-09 DIAGNOSIS — B351 Tinea unguium: Secondary | ICD-10-CM | POA: Diagnosis not present

## 2021-03-09 NOTE — Progress Notes (Signed)
This patient returns to my office for at risk foot care.  This patient requires this care by a professional since this patient will be at risk due to having diabetes with no complications and HIV.  This patient is unable to cut nails herself since the patient cannot reach her nails.These nails are painful walking and wearing shoes.  This patient presents for at risk foot care today.  General Appearance  Alert, conversant and in no acute stress.  Vascular  Dorsalis pedis and posterior tibial  pulses are palpable  bilaterally.  Capillary return is within normal limits  bilaterally. Temperature is within normal limits  bilaterally.  Neurologic  Senn-Weinstein monofilament wire test within normal limits  bilaterally. Muscle power within normal limits bilaterally.  Nails Thick disfigured discolored nails with subungual debris  from hallux to fifth toes bilaterally. No evidence of bacterial infection or drainage bilaterally.  Orthopedic  No limitations of motion  feet .  No crepitus or effusions noted.  No bony pathology or digital deformities noted. HAV  B/L.  Skin  normotropic skin with no porokeratosis noted bilaterally.  No signs of infections or ulcers noted.     Onychomycosis  Pain in right toes  Pain in left toes  Consent was obtained for treatment procedures.   Mechanical debridement of nails 1-5  bilaterally performed with a nail nipper.  Filed with dremel without incident.    Return office visit  3 months                    Told patient to return for periodic foot care and evaluation due to potential at risk complications.   Helane Gunther DPM

## 2021-04-12 ENCOUNTER — Other Ambulatory Visit: Payer: Self-pay | Admitting: Family Medicine

## 2021-04-12 DIAGNOSIS — Z1231 Encounter for screening mammogram for malignant neoplasm of breast: Secondary | ICD-10-CM

## 2021-06-15 ENCOUNTER — Ambulatory Visit: Payer: Medicare Other | Admitting: Podiatry

## 2021-06-22 ENCOUNTER — Other Ambulatory Visit: Payer: Self-pay

## 2021-06-22 ENCOUNTER — Ambulatory Visit (INDEPENDENT_AMBULATORY_CARE_PROVIDER_SITE_OTHER): Payer: Medicare HMO | Admitting: Podiatry

## 2021-06-22 ENCOUNTER — Encounter: Payer: Self-pay | Admitting: Podiatry

## 2021-06-22 DIAGNOSIS — E119 Type 2 diabetes mellitus without complications: Secondary | ICD-10-CM

## 2021-06-22 DIAGNOSIS — M79609 Pain in unspecified limb: Secondary | ICD-10-CM

## 2021-06-22 DIAGNOSIS — B351 Tinea unguium: Secondary | ICD-10-CM | POA: Diagnosis not present

## 2021-06-22 NOTE — Progress Notes (Signed)
This patient returns to my office for at risk foot care.  This patient requires this care by a professional since this patient will be at risk due to having diabetes with no complications and HIV.  This patient is unable to cut nails herself since the patient cannot reach her nails.These nails are painful walking and wearing shoes.  This patient presents for at risk foot care today.  General Appearance  Alert, conversant and in no acute stress.  Vascular  Dorsalis pedis and posterior tibial  pulses are palpable  bilaterally.  Capillary return is within normal limits  bilaterally. Temperature is within normal limits  bilaterally.  Neurologic  Senn-Weinstein monofilament wire test within normal limits  bilaterally. Muscle power within normal limits bilaterally.  Nails Thick disfigured discolored nails with subungual debris  from hallux to fifth toes bilaterally. No evidence of bacterial infection or drainage bilaterally.  Orthopedic  No limitations of motion  feet .  No crepitus or effusions noted.  No bony pathology or digital deformities noted. HAV  B/L.  Skin  normotropic skin with no porokeratosis noted bilaterally.  No signs of infections or ulcers noted.     Onychomycosis  Pain in right toes  Pain in left toes  Consent was obtained for treatment procedures.   Mechanical debridement of nails 1-5  bilaterally performed with a nail nipper.  Filed with dremel without incident.    Return office visit  3 months                    Told patient to return for periodic foot care and evaluation due to potential at risk complications.   Helane Gunther DPM

## 2021-09-28 ENCOUNTER — Encounter: Payer: Self-pay | Admitting: Podiatry

## 2021-09-28 ENCOUNTER — Ambulatory Visit (INDEPENDENT_AMBULATORY_CARE_PROVIDER_SITE_OTHER): Payer: Medicare HMO | Admitting: Podiatry

## 2021-09-28 ENCOUNTER — Other Ambulatory Visit: Payer: Self-pay

## 2021-09-28 DIAGNOSIS — M79609 Pain in unspecified limb: Secondary | ICD-10-CM

## 2021-09-28 DIAGNOSIS — E119 Type 2 diabetes mellitus without complications: Secondary | ICD-10-CM | POA: Diagnosis not present

## 2021-09-28 DIAGNOSIS — B351 Tinea unguium: Secondary | ICD-10-CM

## 2021-09-28 NOTE — Progress Notes (Signed)
This patient returns to my office for at risk foot care.  This patient requires this care by a professional since this patient will be at risk due to having diabetes with no complications and HIV.  This patient is unable to cut nails herself since the patient cannot reach her nails.These nails are painful walking and wearing shoes.  This patient presents for at risk foot care today.  General Appearance  Alert, conversant and in no acute stress.  Vascular  Dorsalis pedis and posterior tibial  pulses are palpable  bilaterally.  Capillary return is within normal limits  bilaterally. Temperature is within normal limits  bilaterally.  Neurologic  Senn-Weinstein monofilament wire test within normal limits  bilaterally. Muscle power within normal limits bilaterally.  Nails Thick disfigured discolored nails with subungual debris  from hallux to fifth toes bilaterally. No evidence of bacterial infection or drainage bilaterally.  Orthopedic  No limitations of motion  feet .  No crepitus or effusions noted.  No bony pathology or digital deformities noted. HAV  B/L.  Skin  normotropic skin with no porokeratosis noted bilaterally.  No signs of infections or ulcers noted.     Onychomycosis  Pain in right toes  Pain in left toes  Consent was obtained for treatment procedures.   Mechanical debridement of nails 1-5  bilaterally performed with a nail nipper.  Filed with dremel without incident.    Return office visit  3 months                    Told patient to return for periodic foot care and evaluation due to potential at risk complications.   Ani Deoliveira DPM  

## 2022-01-29 ENCOUNTER — Ambulatory Visit (INDEPENDENT_AMBULATORY_CARE_PROVIDER_SITE_OTHER): Payer: Medicare HMO | Admitting: Podiatry

## 2022-01-29 ENCOUNTER — Other Ambulatory Visit: Payer: Self-pay

## 2022-01-29 ENCOUNTER — Encounter: Payer: Self-pay | Admitting: Podiatry

## 2022-01-29 DIAGNOSIS — B351 Tinea unguium: Secondary | ICD-10-CM | POA: Diagnosis not present

## 2022-01-29 DIAGNOSIS — M79609 Pain in unspecified limb: Secondary | ICD-10-CM | POA: Diagnosis not present

## 2022-01-29 DIAGNOSIS — E119 Type 2 diabetes mellitus without complications: Secondary | ICD-10-CM | POA: Diagnosis not present

## 2022-01-29 NOTE — Progress Notes (Signed)
This patient returns to my office for at risk foot care.  This patient requires this care by a professional since this patient will be at risk due to having diabetes with no complications and HIV.  This patient is unable to cut nails herself since the patient cannot reach her nails.These nails are painful walking and wearing shoes.  This patient presents for at risk foot care today.  General Appearance  Alert, conversant and in no acute stress.  Vascular  Dorsalis pedis and posterior tibial  pulses are palpable  bilaterally.  Capillary return is within normal limits  bilaterally. Temperature is within normal limits  bilaterally.  Neurologic  Senn-Weinstein monofilament wire test within normal limits  bilaterally. Muscle power within normal limits bilaterally.  Nails Thick disfigured discolored nails with subungual debris  from hallux to fifth toes bilaterally. No evidence of bacterial infection or drainage bilaterally.  Orthopedic  No limitations of motion  feet .  No crepitus or effusions noted.  No bony pathology or digital deformities noted. HAV  B/L.  Skin  normotropic skin with no porokeratosis noted bilaterally.  No signs of infections or ulcers noted.     Onychomycosis  Pain in right toes  Pain in left toes  Consent was obtained for treatment procedures.   Mechanical debridement of nails 1-5  bilaterally performed with a nail nipper.  Filed with dremel without incident.    Return office visit  3 months                    Told patient to return for periodic foot care and evaluation due to potential at risk complications.   Kodah Maret DPM  

## 2022-02-23 ENCOUNTER — Emergency Department: Payer: Medicare HMO

## 2022-02-23 ENCOUNTER — Other Ambulatory Visit: Payer: Self-pay

## 2022-02-23 ENCOUNTER — Emergency Department
Admission: EM | Admit: 2022-02-23 | Discharge: 2022-02-24 | Disposition: A | Discharge status: Home / Self Care | Payer: Medicare HMO | Attending: Emergency Medicine | Admitting: Emergency Medicine

## 2022-02-23 DIAGNOSIS — D72829 Elevated white blood cell count, unspecified: Secondary | ICD-10-CM | POA: Insufficient documentation

## 2022-02-23 DIAGNOSIS — E119 Type 2 diabetes mellitus without complications: Secondary | ICD-10-CM | POA: Insufficient documentation

## 2022-02-23 DIAGNOSIS — E039 Hypothyroidism, unspecified: Secondary | ICD-10-CM | POA: Insufficient documentation

## 2022-02-23 DIAGNOSIS — R1011 Right upper quadrant pain: Secondary | ICD-10-CM | POA: Insufficient documentation

## 2022-02-23 DIAGNOSIS — J449 Chronic obstructive pulmonary disease, unspecified: Secondary | ICD-10-CM | POA: Insufficient documentation

## 2022-02-23 DIAGNOSIS — Z21 Asymptomatic human immunodeficiency virus [HIV] infection status: Secondary | ICD-10-CM | POA: Insufficient documentation

## 2022-02-23 DIAGNOSIS — Y906 Blood alcohol level of 120-199 mg/100 ml: Secondary | ICD-10-CM | POA: Diagnosis not present

## 2022-02-23 DIAGNOSIS — Z7951 Long term (current) use of inhaled steroids: Secondary | ICD-10-CM | POA: Diagnosis not present

## 2022-02-23 DIAGNOSIS — S299XXA Unspecified injury of thorax, initial encounter: Secondary | ICD-10-CM | POA: Diagnosis not present

## 2022-02-23 DIAGNOSIS — Z794 Long term (current) use of insulin: Secondary | ICD-10-CM | POA: Insufficient documentation

## 2022-02-23 DIAGNOSIS — Z79899 Other long term (current) drug therapy: Secondary | ICD-10-CM | POA: Insufficient documentation

## 2022-02-23 DIAGNOSIS — S0990XA Unspecified injury of head, initial encounter: Secondary | ICD-10-CM | POA: Insufficient documentation

## 2022-02-23 DIAGNOSIS — N9489 Other specified conditions associated with female genital organs and menstrual cycle: Secondary | ICD-10-CM | POA: Insufficient documentation

## 2022-02-23 LAB — COMPREHENSIVE METABOLIC PANEL
ALT: 29 U/L (ref 0–44)
AST: 37 U/L (ref 15–41)
Albumin: 3.7 g/dL (ref 3.5–5.0)
Alkaline Phosphatase: 41 U/L (ref 38–126)
Anion gap: 11 (ref 5–15)
BUN: <5 mg/dL — ABNORMAL LOW (ref 6–20)
CO2: 21 mmol/L — ABNORMAL LOW (ref 22–32)
Calcium: 8.7 mg/dL — ABNORMAL LOW (ref 8.9–10.3)
Chloride: 107 mmol/L (ref 98–111)
Creatinine, Ser: 0.71 mg/dL (ref 0.44–1.00)
GFR, Estimated: >60 mL/min (ref >60)
Glucose, Bld: 92 mg/dL (ref 70–99)
Potassium: 4.1 mmol/L (ref 3.5–5.1)
Sodium: 139 mmol/L (ref 135–145)
Total Bilirubin: 1.2 mg/dL (ref 0.3–1.2)
Total Protein: 7.4 g/dL (ref 6.5–8.1)

## 2022-02-23 LAB — CBC WITH DIFFERENTIAL/PLATELET
Abs Immature Granulocytes: 0.04 10*3/uL (ref 0.00–0.07)
Basophils Absolute: 0.1 10*3/uL (ref 0.0–0.1)
Basophils Relative: 1 %
Eosinophils Absolute: 0.1 10*3/uL (ref 0.0–0.5)
Eosinophils Relative: 1 %
HCT: 45.6 % (ref 36.0–46.0)
Hemoglobin: 16.3 g/dL — ABNORMAL HIGH (ref 12.0–15.0)
Immature Granulocytes: 0 %
Lymphocytes Relative: 29 %
Lymphs Abs: 4.1 10*3/uL — ABNORMAL HIGH (ref 0.7–4.0)
MCH: 33.2 pg (ref 26.0–34.0)
MCHC: 35.7 g/dL (ref 30.0–36.0)
MCV: 92.9 fL (ref 80.0–100.0)
Monocytes Absolute: 1.2 10*3/uL — ABNORMAL HIGH (ref 0.1–1.0)
Monocytes Relative: 8 %
Neutro Abs: 8.7 10*3/uL — ABNORMAL HIGH (ref 1.7–7.7)
Neutrophils Relative %: 61 %
Platelets: 218 10*3/uL (ref 150–400)
RBC: 4.91 MIL/uL (ref 3.87–5.11)
RDW: 12.6 % (ref 11.5–15.5)
WBC: 14.2 10*3/uL — ABNORMAL HIGH (ref 4.0–10.5)
nRBC: 0 % (ref 0.0–0.2)

## 2022-02-23 LAB — HCG, QUANTITATIVE, PREGNANCY: hCG, Beta Chain, Quant, S: <1 m[IU]/mL (ref <5)

## 2022-02-23 LAB — ETHANOL: Alcohol, Ethyl (B): 123 mg/dL — ABNORMAL HIGH (ref <10)

## 2022-02-23 MED ORDER — IOHEXOL 300 MG/ML  SOLN
75.0000 mL | Freq: Once | INTRAMUSCULAR | Status: AC | PRN
  Administered 2022-02-23: 75 mL via INTRAVENOUS

## 2022-02-23 NOTE — ED Notes (Signed)
Pt back from CT
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
## 2022-02-23 NOTE — ED Notes (Signed)
Dr. Cherylann Banas notified via secure chat to let RN know when pt has disposition for APS notification per note made previously.

## 2022-02-23 NOTE — ED Notes (Addendum)
[place]ice pt's friend spoke with this RN [phone] reports she helps take care of pt if anything needed she can provide information

## 2022-02-23 NOTE — ED Notes (Signed)
Spoke killie morrow with  aps, [phone]. Ms. Kateri Plummer would like a phone call when disposition is pending to notify pt's caseworker.
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
## 2022-02-23 NOTE — ED Notes (Signed)
Pt to Xray.

## 2022-02-24 ENCOUNTER — Emergency Department: Payer: Medicare HMO

## 2022-02-24 DIAGNOSIS — S0990XA Unspecified injury of head, initial encounter: Secondary | ICD-10-CM | POA: Diagnosis not present

## 2022-02-24 LAB — URINE DRUG SCREEN, QUALITATIVE (ARMC ONLY)
Amphetamines, Ur Screen: NOT DETECTED
Barbiturates, Ur Screen: NOT DETECTED
Benzodiazepine, Ur Scrn: NOT DETECTED
Cannabinoid 50 Ng, Ur ~~LOC~~: POSITIVE — AB
Cocaine Metabolite,Ur ~~LOC~~: NOT DETECTED
MDMA (Ecstasy)Ur Screen: NOT DETECTED
Methadone Scn, Ur: NOT DETECTED
Opiate, Ur Screen: NOT DETECTED
Phencyclidine (PCP) Ur S: NOT DETECTED
Tricyclic, Ur Screen: NOT DETECTED

## 2022-02-24 LAB — URINALYSIS, ROUTINE W REFLEX MICROSCOPIC
Bacteria, UA: NONE SEEN
Bilirubin Urine: NEGATIVE
Glucose, UA: NEGATIVE mg/dL
Ketones, ur: NEGATIVE mg/dL
Leukocytes,Ua: NEGATIVE
Nitrite: NEGATIVE
Protein, ur: NEGATIVE mg/dL
Specific Gravity, Urine: >1.046 — ABNORMAL HIGH (ref 1.005–1.030)
pH: 6 (ref 5.0–8.0)

## 2022-02-24 LAB — LIPASE, BLOOD: Lipase: 49 U/L (ref 11–51)

## 2022-02-24 MED ORDER — IOHEXOL 300 MG/ML  SOLN
80.0000 mL | Freq: Once | INTRAMUSCULAR | Status: AC | PRN
  Administered 2022-02-24: 80 mL via INTRAVENOUS

## 2022-02-24 MED ORDER — LACTATED RINGERS IV BOLUS
1000.0000 mL | Freq: Once | INTRAVENOUS | Status: AC
  Administered 2022-02-24: 1000 mL via INTRAVENOUS
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
## 2022-02-24 NOTE — Discharge Instructions (Signed)
You may alternate Tylenol 1000 mg every 6 hours as needed for pain, fever and Ibuprofen 800 mg every 6-8 hours as needed for pain, fever.  Please take Ibuprofen with food.  Do not take more than 4000 mg of Tylenol (acetaminophen) in a 24 hour period. ° °
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
## 2022-02-26 ENCOUNTER — Ambulatory Visit: Payer: Medicare HMO | Admitting: Podiatry

## 2022-05-03 ENCOUNTER — Ambulatory Visit (INDEPENDENT_AMBULATORY_CARE_PROVIDER_SITE_OTHER): Payer: Medicare HMO | Admitting: Podiatry

## 2022-05-03 ENCOUNTER — Encounter: Payer: Self-pay | Admitting: Podiatry

## 2022-05-03 DIAGNOSIS — E119 Type 2 diabetes mellitus without complications: Secondary | ICD-10-CM

## 2022-05-03 DIAGNOSIS — M79609 Pain in unspecified limb: Secondary | ICD-10-CM

## 2022-05-03 DIAGNOSIS — B351 Tinea unguium: Secondary | ICD-10-CM

## 2022-05-03 NOTE — Progress Notes (Signed)
This patient returns to my office for at risk foot care.  This patient requires this care by a professional since this patient will be at risk due to having diabetes with no complications and HIV.  This patient is unable to cut nails herself since the patient cannot reach her nails.These nails are painful walking and wearing shoes.  This patient presents for at risk foot care today.  General Appearance  Alert, conversant and in no acute stress.  Vascular  Dorsalis pedis and posterior tibial  pulses are palpable  bilaterally.  Capillary return is within normal limits  bilaterally. Temperature is within normal limits  bilaterally.  Neurologic  Senn-Weinstein monofilament wire test within normal limits  bilaterally. Muscle power within normal limits bilaterally.  Nails Thick disfigured discolored nails with subungual debris  from hallux to fifth toes bilaterally. No evidence of bacterial infection or drainage bilaterally.  Orthopedic  No limitations of motion  feet .  No crepitus or effusions noted.  No bony pathology or digital deformities noted. HAV  B/L.  Skin  normotropic skin with no porokeratosis noted bilaterally.  No signs of infections or ulcers noted.     Onychomycosis  Pain in right toes  Pain in left toes  Consent was obtained for treatment procedures.   Mechanical debridement of nails 1-5  bilaterally performed with a nail nipper.  Filed with dremel without incident.    Return office visit  6   months                    Told patient to return for periodic foot care and evaluation due to potential at risk complications.   Aayliah Rotenberry DPM  

## 2022-11-08 ENCOUNTER — Encounter: Payer: Self-pay | Admitting: Podiatry

## 2022-11-08 ENCOUNTER — Ambulatory Visit (INDEPENDENT_AMBULATORY_CARE_PROVIDER_SITE_OTHER): Payer: Medicare HMO | Admitting: Podiatry

## 2022-11-08 DIAGNOSIS — E119 Type 2 diabetes mellitus without complications: Secondary | ICD-10-CM

## 2022-11-08 DIAGNOSIS — B351 Tinea unguium: Secondary | ICD-10-CM | POA: Diagnosis not present

## 2022-11-08 DIAGNOSIS — M79609 Pain in unspecified limb: Secondary | ICD-10-CM

## 2022-11-08 NOTE — Progress Notes (Signed)
This patient returns to my office for at risk foot care.  This patient requires this care by a professional since this patient will be at risk due to having diabetes with no complications and HIV.  This patient is unable to cut nails herself since the patient cannot reach her nails.These nails are painful walking and wearing shoes.  This patient presents for at risk foot care today.  General Appearance  Alert, conversant and in no acute stress.  Vascular  Dorsalis pedis and posterior tibial  pulses are palpable  bilaterally.  Capillary return is within normal limits  bilaterally. Temperature is within normal limits  bilaterally.  Neurologic  Senn-Weinstein monofilament wire test within normal limits  bilaterally. Muscle power within normal limits bilaterally.  Nails Thick disfigured discolored nails with subungual debris  from hallux to fifth toes bilaterally. No evidence of bacterial infection or drainage bilaterally.  Orthopedic  No limitations of motion  feet .  No crepitus or effusions noted.  No bony pathology or digital deformities noted. HAV  B/L.  Skin  normotropic skin with no porokeratosis noted bilaterally.  No signs of infections or ulcers noted.     Onychomycosis  Pain in right toes  Pain in left toes  Consent was obtained for treatment procedures.   Mechanical debridement of nails 1-5  bilaterally performed with a nail nipper.  Filed with dremel without incident.    Return office visit  6   months                    Told patient to return for periodic foot care and evaluation due to potential at risk complications.   Helane Gunther DPM

## 2023-04-11 ENCOUNTER — Encounter: Payer: Self-pay | Admitting: Podiatry

## 2023-04-11 ENCOUNTER — Ambulatory Visit (INDEPENDENT_AMBULATORY_CARE_PROVIDER_SITE_OTHER): Payer: Medicare HMO | Admitting: Podiatry

## 2023-04-11 DIAGNOSIS — B351 Tinea unguium: Secondary | ICD-10-CM | POA: Diagnosis not present

## 2023-04-11 DIAGNOSIS — M79609 Pain in unspecified limb: Secondary | ICD-10-CM | POA: Diagnosis not present

## 2023-04-11 DIAGNOSIS — E119 Type 2 diabetes mellitus without complications: Secondary | ICD-10-CM | POA: Diagnosis not present

## 2023-04-11 NOTE — Progress Notes (Signed)
This patient returns to my office for at risk foot care.  This patient requires this care by a professional since this patient will be at risk due to having diabetes with no complications and HIV.  This patient is unable to cut nails herself since the patient cannot reach her nails.These nails are painful walking and wearing shoes.  This patient presents for at risk foot care today.  General Appearance  Alert, conversant and in no acute stress.  Vascular  Dorsalis pedis and posterior tibial  pulses are palpable  bilaterally.  Capillary return is within normal limits  bilaterally. Temperature is within normal limits  bilaterally.  Neurologic  Senn-Weinstein monofilament wire test within normal limits  bilaterally. Muscle power within normal limits bilaterally.  Nails Thick disfigured discolored nails with subungual debris  from hallux to fifth toes bilaterally. No evidence of bacterial infection or drainage bilaterally.  Orthopedic  No limitations of motion  feet .  No crepitus or effusions noted.  No bony pathology or digital deformities noted. HAV  B/L.  Skin  normotropic skin with no porokeratosis noted bilaterally.  No signs of infections or ulcers noted.     Onychomycosis  Pain in right toes  Pain in left toes  Consent was obtained for treatment procedures.   Mechanical debridement of nails 1-5  bilaterally performed with a nail nipper.  Filed with dremel without incident.    Return office visit  6   months                    Told patient to return for periodic foot care and evaluation due to potential at risk complications.   Ediel Unangst DPM  

## 2023-04-16 ENCOUNTER — Other Ambulatory Visit: Payer: Self-pay | Admitting: Neurology

## 2023-04-16 DIAGNOSIS — R531 Weakness: Secondary | ICD-10-CM

## 2023-10-10 ENCOUNTER — Ambulatory Visit: Payer: Medicare HMO | Admitting: Podiatry

## 2023-10-10 DIAGNOSIS — M79609 Pain in unspecified limb: Secondary | ICD-10-CM

## 2023-10-10 DIAGNOSIS — E119 Type 2 diabetes mellitus without complications: Secondary | ICD-10-CM | POA: Diagnosis not present

## 2023-10-10 DIAGNOSIS — B351 Tinea unguium: Secondary | ICD-10-CM | POA: Diagnosis not present

## 2023-10-10 NOTE — Progress Notes (Signed)
  Subjective:  Patient ID: Kristen Villegas, female    DOB: 01/18/1976,  MRN: [id number]  47 y.o. female presents to clinic with  at risk foot care. Patient has history of HIV and diabetes and painful elongated mycotic toenails 1-5 bilaterally which are tender when wearing enclosed shoe gear. Pain is relieved with periodic professional debridement.  Chief Complaint  Patient presents with   Diabetes    BS-126 A1C- PCPV-09/2023   New problem(s): None   PCP is Armando Gang, FNP.  Allergies  Allergen Reactions   Penicillin G Other (See Comments)   Penicillins Nausea And Vomiting   Review of Systems: Negative except as noted in the HPI.   Objective:  Kristen Villegas is a pleasant 47 y.o. female in NAD.Marland Kitchen  Vascular Examination: Vascular status intact b/l with palpable pedal pulses. CFT immediate b/l. No edema. No pain with calf compression b/l. Skin temperature gradient WNL b/l. Pedal hair absent.  Neurological Examination: Sensation grossly intact b/l with 10 gram monofilament. Vibratory sensation intact b/l.   Dermatological Examination: Pedal skin with normal turgor, texture and tone b/l. Toenails 1-5 b/l thick, discolored, elongated with subungual debris and pain on dorsal palpation. No hyperkeratotic lesions noted b/l.   Musculoskeletal Examination: Right LE weakness.  Radiographs: None   Assessment:   1. Pain due to onychomycosis of nail   2. Diabetes mellitus without complication (HCC)    Plan:  Patient was evaluated and treated. All patient's and/or POA's questions/concerns addressed on today's visit. Toenails 1-5 debrided in length and girth without incident. Continue soft, supportive shoe gear daily. Report any pedal injuries to medical professional. Call office if there are any questions/concerns. -Patient/POA to call should there be question/concern in the interim.  Return in about 3 months (around 01/10/2024).  Freddie Breech, DPM

## 2023-10-11 ENCOUNTER — Ambulatory Visit: Payer: Medicare HMO | Admitting: Podiatry

## 2023-10-15 ENCOUNTER — Encounter: Payer: Self-pay | Admitting: Podiatry

## 2024-01-13 ENCOUNTER — Ambulatory Visit (INDEPENDENT_AMBULATORY_CARE_PROVIDER_SITE_OTHER): Payer: Medicare HMO | Admitting: Podiatry

## 2024-01-13 DIAGNOSIS — Z91199 Patient's noncompliance with other medical treatment and regimen due to unspecified reason: Secondary | ICD-10-CM

## 2024-01-13 NOTE — Progress Notes (Signed)
 1. No-show for appointment

## 2024-03-09 ENCOUNTER — Ambulatory Visit (INDEPENDENT_AMBULATORY_CARE_PROVIDER_SITE_OTHER): Admitting: Podiatry

## 2024-03-09 DIAGNOSIS — Z91198 Patient's noncompliance with other medical treatment and regimen for other reason: Secondary | ICD-10-CM

## 2024-03-09 NOTE — Progress Notes (Signed)
 Failure to attend appointment with reason given Canceled appt due to lack of transportation.

## 2024-06-15 ENCOUNTER — Encounter: Payer: Self-pay | Admitting: Podiatry

## 2024-06-15 ENCOUNTER — Ambulatory Visit (INDEPENDENT_AMBULATORY_CARE_PROVIDER_SITE_OTHER): Admitting: Podiatry

## 2024-06-15 VITALS — Ht 65.0 in | Wt 214.0 lb

## 2024-06-15 DIAGNOSIS — E119 Type 2 diabetes mellitus without complications: Secondary | ICD-10-CM | POA: Diagnosis not present

## 2024-06-15 DIAGNOSIS — B351 Tinea unguium: Secondary | ICD-10-CM

## 2024-06-15 DIAGNOSIS — M79609 Pain in unspecified limb: Secondary | ICD-10-CM

## 2024-06-21 NOTE — Progress Notes (Signed)
ANNUAL DIABETIC FOOT EXAM  Subjective: Kristen Villegas presents today for annual diabetic foot exam.  Chief Complaint  Patient presents with   Nail Problem    Pt is here for [facility] unsure of last A1C PCP is Dr Donal and LOV was sometime last year.   Patient confirms h/o diabetes.  Patient denies any h/o foot wounds.  Donal Channing SQUIBB, FNP is patient's PCP.  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 of cervix    HIV disease (HCC)    Hypercholesterolemia    Hypothyroidism    Memory difficulties    PML (progressive multifocal leukoencephalopathy) (HCC)    Recurrent UTI    Seizures (HCC)    Patient Active Problem List   Diagnosis Date Noted   Seizures (HCC) 06/03/2020   Smoker 02/07/2016   Dysuria 09/20/2015   Cervical dysplasia 06/15/2015   Tinea capitis 08/31/2014   Cocaine abuse (HCC) 09/08/2013   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   Multiphasic screening 04/24/2013   Hyperlipidemia 04/23/2013   TSH deficiency 04/23/2013   Diabetes (HCC) 04/21/2013   HIV disease (HCC) 04/21/2013   Past Surgical History:  Procedure Laterality Date   CHOLECYSTECTOMY N/A 01/25/2017   Procedure: LAPAROSCOPIC CHOLECYSTECTOMY WITH INTRAOPERATIVE CHOLANGIOGRAM;  Surgeon: Dann Hummer, MD;  Location: [facility] OR;  Service: General;  Laterality: N/A;   COLONOSCOPY WITH PROPOFOL  N/A 01/08/2020   Procedure: COLONOSCOPY WITH PROPOFOL ;  Surgeon: Dessa Reyes ORN, MD;  Location: [facility] ENDOSCOPY;  Service: Endoscopy;  Laterality: N/A;   ESOPHAGOGASTRODUODENOSCOPY (EGD) WITH PROPOFOL  N/A 01/08/2020   Procedure: ESOPHAGOGASTRODUODENOSCOPY (EGD) WITH PROPOFOL ;  Surgeon: Dessa Reyes ORN, MD;   Location: [facility] ENDOSCOPY;  Service: Endoscopy;  Laterality: N/A;   LUNG SURGERY     Current Outpatient Medications on File Prior to Visit  Medication Sig Dispense Refill   acetaminophen  (TYLENOL ) 325 MG tablet Take 650 mg by mouth every 6 (six) hours as needed for moderate pain or headache.      albuterol (PROVENTIL HFA;VENTOLIN HFA) 108 (90 Base) MCG/ACT inhaler Inhale 2 puffs into the lungs 4 (four) times daily as needed for wheezing or shortness of breath.      BD PEN NEEDLE NANO U/F 32G X 4 MM MISC      benztropine (COGENTIN) 1 MG tablet Take 1 mg by mouth daily.      buPROPion (WELLBUTRIN XL) 300 MG 24 hr tablet Take 300 mg by mouth daily.      buPROPion (WELLBUTRIN XL) 300 MG 24 hr tablet Take 1 tablet by mouth daily.     cetirizine (ZYRTEC) 10 MG tablet Take 10 mg by mouth daily.      cyclobenzaprine  (FLEXERIL ) 10 MG tablet Take 1 tablet (10 mg total) by mouth 3 (three) times daily as needed. (Patient taking differently: Take 10 mg by mouth 3 (three) times daily as needed for muscle spasms.) 15 tablet 0   dicyclomine (BENTYL) 10 MG capsule Take 10 mg by mouth 4 (four) times daily -  before meals and at bedtime.     docusate sodium (COLACE) 100 MG capsule Take 100 mg by mouth 2 (two) times daily.      elvitegravir-cobicistat-emtricitabine-tenofovir (GENVOYA) 150-150-200-10 MG TABS tablet Take  by mouth.     elvitegravir-cobicistat-emtricitabine-tenofovir (STRIBILD) 150-150-200-300 MG TABS tablet Take 1 tablet by mouth daily with breakfast.      ezetimibe (ZETIA) 10 MG tablet Take 10 mg by mouth daily.     FIASP FLEXTOUCH 100 UNIT/ML SOPN      gabapentin (NEURONTIN) 300 MG capsule Take 300 mg by mouth 2 (two) times daily.     glucose blood test strip by Other route.     hydrocortisone 2.5 % cream Apply 1 application topically daily as needed.      hydrOXYzine (VISTARIL) 25 MG capsule      ibuprofen  (ADVIL ,MOTRIN ) 800 MG tablet Take 1 tablet (800 mg total) by mouth every 8 (eight) hours as  needed for moderate pain. (Patient taking differently: Take 600 mg by mouth every 6 (six) hours as needed for moderate pain.) 15 tablet 0   INGREZZA 40 MG CAPS      insulin NPH Human (HUMULIN N,NOVOLIN N) 100 UNIT/ML injection Inject 5-10 Units into the skin See admin instructions. Inject 10 units SQ in the morning and inject 5 units SQ before supper     ketorolac (TORADOL) 10 MG tablet      Lactobacillus Rhamnosus, GG, (CULTURELLE) CAPS Take by mouth.     levETIRAcetam (KEPPRA) 1000 MG tablet      levETIRAcetam (KEPPRA) 500 MG tablet Take 1,000 mg by mouth 2 (two) times daily.      LINZESS 72 MCG capsule      LORazepam (ATIVAN) 0.5 MG tablet Take 0.5 mg by mouth every 6 (six) hours as needed (for agitation).      medroxyPROGESTERone (DEPO-PROVERA) 150 MG/ML injection Inject 150 mg into the muscle every 3 (three) months.      medroxyPROGESTERone Acetate 150 MG/ML SUSY      meloxicam (MOBIC) 15 MG tablet Take by mouth.     miconazole (MICOTIN) 100 MG vaginal suppository Place 100 mg vaginally at bedtime as needed.     Multiple Vitamins-Minerals (MULTIVITAMIN WITH MINERALS) tablet Take by mouth.     nitrofurantoin (MACRODANTIN) 100 MG capsule      omeprazole (PRILOSEC) 20 MG capsule Take 40 mg by mouth 2 (two) times daily.      ondansetron  (ZOFRAN ) 4 MG tablet Take 4 mg by mouth 3 (three) times daily as needed for nausea or vomiting.     polyethylene glycol (MIRALAX / GLYCOLAX) packet Take 17 g by mouth daily as needed for moderate constipation.      potassium chloride  (K-DUR) 10 MEQ tablet      Probiotic Product (PROBIOTIC COLON SUPPORT PO) Take 1 capsule by mouth daily.     risperiDONE (RISPERDAL) 2 MG tablet Take 2 mg by mouth 2 (two) times daily.      rosuvastatin (CRESTOR) 40 MG tablet      simvastatin (ZOCOR) 40 MG tablet simvastatin 40 mg tablet     sulfamethoxazole-trimethoprim (BACTRIM DS) 800-160 MG tablet      TRINTELLIX 10 MG TABS tablet Take 10 mg by mouth daily.     umeclidinium  bromide (INCRUSE ELLIPTA) 62.5 MCG/INH AEPB Inhale 1 puff into the lungs daily.     UNABLE TO FIND by Does not apply route.     UNABLE TO FIND Inject into the skin.     varenicline (CHANTIX) 1 MG tablet Take 1 mg by mouth 2 (two) times daily.      No current facility-administered medications on file prior to visit.    Allergies  Allergen Reactions  Penicillin G Other (See Comments)   Penicillins Nausea And Vomiting   Social History   Occupational History   Not on file  Tobacco Use   Smoking status: Every Day    Current packs/day: 1.00    Average packs/day: 1 pack/day for 28.0 years (28.0 ttl pk-yrs)    Types: Cigarettes   Smokeless tobacco: Never  Substance and Sexual Activity   Alcohol use: Yes    Comment: Quit October 2021   Drug use: Not Currently    Types: Marijuana   Sexual activity: Not on file   Family History  Problem Relation Age of Onset   Diabetes Mother    Diabetes Father    Immunization History  Administered Date(s) Administered   Influenza,inj,Quad PF,6+ Mos 09/15/2013, 10/26/2014, 10/19/2015   Influenza-Unspecified 09/22/2017, 12/07/2019, 10/10/2020   Moderna Sars-Covid-2 Vaccination 09/14/2020   PPD Test 12/28/2013   Pneumococcal Conjugate-13 04/22/2018   Pneumococcal Polysaccharide-23 09/16/2013     Review of Systems: Negative except as noted in the HPI.   Objective: There were no vitals filed for this visit.  Kristen Villegas is a pleasant 48 y.o. female in NAD. AAO X 3.  Diabetic foot exam was performed with the following findings:   Intact posterior tibialis and dorsalis pedis pulses Vascular Examination: Capillary refill time immediate b/l. Palpable pedal pulses. Pedal hair present b/l. No pain with calf compression b/l. Skin temperature gradient WNL b/l. No cyanosis or clubbing b/l. No ischemia or gangrene noted b/l.   Neurological Examination: Sensation grossly intact b/l with 10 gram monofilament. Vibratory sensation intact b/l.    Dermatological Examination: Pedal skin with normal turgor, texture and tone b/l.  No open wounds. No interdigital macerations.   Toenails left great toe and 2-5 b/l thick, discolored, elongated with subungual debris and pain on dorsal palpation.   Evidence of attempted trimming right great toe medial border into corner. No erythema, no edema, no drainage.  Musculoskeletal Examination: Muscle strength 5/5 to all LE muscle groups of left lower extremity. Lower extremity weakness RLE.  Radiographs: None     Lab Results  Component Value Date   HGBA1C 5.4 01/25/2017   ADA Risk Categorization: Low Risk :  Patient has all of the following: Intact protective sensation No prior foot ulcer  No severe deformity Pedal pulses present  Assessment: 1. Pain due to onychomycosis of nail   2. Diabetes mellitus without complication (HCC)   3. Encounter for diabetic foot exam (HCC)     Plan: Diabetic foot examination performed today.  All patient's and/or POA's questions/concerns addressed on today's visit. Mycotic toenails 1-5 debrided in length and girth without incident. Continue daily foot inspections and monitor blood glucose per PCP/Endocrinologist's recommendations. Continue soft, supportive shoe gear daily. Report any pedal injuries to medical professional. Call office if there are any questions/concerns. -Patient/POA to call should there be question/concern in the interim. Return in about 3 months (around 09/15/2024).  Delon LITTIE Merlin, DPM      [place] LOCATION: [address], [place] [address]                   Office [phone]   [facility] LOCATION: [address] [place], [place] [address] Office [phone]      

## 2024-09-17 ENCOUNTER — Encounter: Payer: Self-pay | Admitting: Podiatry

## 2024-09-17 ENCOUNTER — Ambulatory Visit (INDEPENDENT_AMBULATORY_CARE_PROVIDER_SITE_OTHER): Admitting: Podiatry

## 2024-09-17 DIAGNOSIS — M79609 Pain in unspecified limb: Secondary | ICD-10-CM

## 2024-09-17 DIAGNOSIS — B351 Tinea unguium: Secondary | ICD-10-CM

## 2024-09-17 DIAGNOSIS — E119 Type 2 diabetes mellitus without complications: Secondary | ICD-10-CM | POA: Diagnosis not present

## 2024-09-20 NOTE — Progress Notes (Signed)
  Subjective:  Patient ID: Kristen Villegas, female    DOB: 27-Dec-1976,  MRN: [id number]  Kristen Villegas presents to clinic today for preventative diabetic foot care for painful mycotic toenails of both feet that are difficult to trim. Pain interferes with daily activities and wearing enclosed shoe gear comfortably.  Chief Complaint  Patient presents with   [facility]    Rm4 Diabetic foot care/ Dr. Channing Schaffer last visit May 2024/ A1c 4.7   New problem(s): None.   PCP is Schaffer Channing SQUIBB, FNP.  Allergies  Allergen Reactions   Penicillin G Other (See Comments)   Penicillins Nausea And Vomiting    Review of Systems: Negative except as noted in the HPI.  Objective: No changes noted in today's physical examination. There were no vitals filed for this visit. Kristen Villegas is a pleasant 47 y.o. female in NAD. AAO x 3.  Vascular Examination: Capillary refill time immediate b/l. Palpable pedal pulses. Pedal hair present b/l. No pain with calf compression b/l. Skin temperature gradient WNL b/l. No cyanosis or clubbing b/l. No ischemia or gangrene noted b/l.   Neurological Examination: Sensation grossly intact b/l with 10 gram monofilament. Vibratory sensation intact b/l.   Dermatological Examination: Pedal skin with normal turgor, texture and tone b/l.  No open wounds. No interdigital macerations.   Toenails left great toe and 2-5 b/l thick, discolored, elongated with subungual debris and pain on dorsal palpation.   Evidence of attempted trimming right great toe medial border into corner. No erythema, no edema, no drainage.  Musculoskeletal Examination: Muscle strength 5/5 to all LE muscle groups of left lower extremity. Lower extremity weakness RLE. Using rollator for ambulation assistance.  Radiographs: None Assessment/Plan: 1. Pain due to onychomycosis of nail   2. Diabetes mellitus without complication [facility])    Consent given for treatment. Patient examined. All  patient's and/or POA's questions/concerns addressed on today's visit. Toenails 1-5 debrided in length and girth without incident. Continue foot and shoe inspections daily. Monitor blood glucose per PCP/Endocrinologist's recommendations. Continue soft, supportive shoe gear daily. Report any pedal injuries to medical professional. Call office if there are any questions/concerns. -Patient/POA to call should there be question/concern in the interim.   Return in about 3 months (around 12/17/2024).  Kristen Villegas, DPM      [place] LOCATION: [address] [place] [address]                   Office [phone]   [facility] LOCATION: [address] [place], [place] [address] Office [phone]

## 2024-12-17 ENCOUNTER — Ambulatory Visit: Admitting: Podiatry

## 2025-03-01 ENCOUNTER — Ambulatory Visit: Admitting: Podiatry
